# Patient Record
Sex: Female | Born: 1935 | Race: White | Hispanic: No | Smoking: Never smoker
Health system: Southern US, Community
[De-identification: ages and names within clinical notes are randomized; demographics above are authoritative.]

## PROBLEM LIST (undated history)

## (undated) DIAGNOSIS — J339 Nasal polyp, unspecified: Secondary | ICD-10-CM

## (undated) DIAGNOSIS — I82409 Acute embolism and thrombosis of unspecified deep veins of unspecified lower extremity: Secondary | ICD-10-CM

## (undated) DIAGNOSIS — J45909 Unspecified asthma, uncomplicated: Secondary | ICD-10-CM

## (undated) HISTORY — DX: Acute embolism and thrombosis of unspecified deep veins of unspecified lower extremity: I82.409

## (undated) HISTORY — PX: FOOT SURGERY: SHX648

## (undated) HISTORY — PX: TOTAL ABDOMINAL HYSTERECTOMY: SHX209

## (undated) HISTORY — PX: CHOLECYSTECTOMY: SHX55

## (undated) HISTORY — PX: TONSILLECTOMY: SHX5217

## (undated) HISTORY — DX: Unspecified asthma, uncomplicated: J45.909

## (undated) HISTORY — DX: Nasal polyp, unspecified: J33.9

---

## 2001-08-04 ENCOUNTER — Inpatient Hospital Stay (HOSPITAL_COMMUNITY): Admission: AD | Admit: 2001-08-04 | Discharge: 2001-08-08 | Payer: Self-pay | Admitting: Vascular Surgery

## 2003-07-30 ENCOUNTER — Ambulatory Visit: Admission: RE | Admit: 2003-07-30 | Discharge: 2003-07-30 | Payer: Self-pay | Admitting: Family Medicine

## 2004-07-20 ENCOUNTER — Ambulatory Visit: Payer: Self-pay | Admitting: Internal Medicine

## 2004-09-14 ENCOUNTER — Ambulatory Visit: Payer: Self-pay | Admitting: Internal Medicine

## 2004-10-20 ENCOUNTER — Ambulatory Visit: Payer: Self-pay | Admitting: Internal Medicine

## 2004-10-28 ENCOUNTER — Ambulatory Visit: Payer: Self-pay | Admitting: Internal Medicine

## 2004-12-10 ENCOUNTER — Ambulatory Visit: Payer: Self-pay | Admitting: Internal Medicine

## 2004-12-17 ENCOUNTER — Ambulatory Visit: Payer: Self-pay | Admitting: Internal Medicine

## 2005-03-26 ENCOUNTER — Ambulatory Visit: Payer: Self-pay | Admitting: Internal Medicine

## 2005-04-13 ENCOUNTER — Ambulatory Visit: Payer: Self-pay | Admitting: Pulmonary Disease

## 2005-04-26 ENCOUNTER — Ambulatory Visit: Payer: Self-pay | Admitting: Internal Medicine

## 2005-05-17 ENCOUNTER — Ambulatory Visit: Payer: Self-pay | Admitting: Internal Medicine

## 2005-05-31 ENCOUNTER — Ambulatory Visit: Payer: Self-pay | Admitting: Internal Medicine

## 2005-09-28 ENCOUNTER — Ambulatory Visit: Payer: Self-pay | Admitting: Internal Medicine

## 2005-10-13 ENCOUNTER — Ambulatory Visit (HOSPITAL_COMMUNITY): Admission: RE | Admit: 2005-10-13 | Discharge: 2005-10-13 | Payer: Self-pay | Admitting: Otolaryngology

## 2005-12-28 ENCOUNTER — Ambulatory Visit: Payer: Self-pay | Admitting: Internal Medicine

## 2006-04-18 ENCOUNTER — Ambulatory Visit: Payer: Self-pay | Admitting: Internal Medicine

## 2006-10-18 ENCOUNTER — Ambulatory Visit: Payer: Self-pay | Admitting: Internal Medicine

## 2007-01-31 ENCOUNTER — Ambulatory Visit: Payer: Self-pay | Admitting: Internal Medicine

## 2007-05-02 ENCOUNTER — Ambulatory Visit: Payer: Self-pay | Admitting: Internal Medicine

## 2007-08-31 DIAGNOSIS — J209 Acute bronchitis, unspecified: Secondary | ICD-10-CM

## 2007-08-31 DIAGNOSIS — Z8672 Personal history of thrombophlebitis: Secondary | ICD-10-CM | POA: Insufficient documentation

## 2007-08-31 DIAGNOSIS — J330 Polyp of nasal cavity: Secondary | ICD-10-CM | POA: Insufficient documentation

## 2007-08-31 DIAGNOSIS — J329 Chronic sinusitis, unspecified: Secondary | ICD-10-CM | POA: Insufficient documentation

## 2007-08-31 DIAGNOSIS — J309 Allergic rhinitis, unspecified: Secondary | ICD-10-CM | POA: Insufficient documentation

## 2007-08-31 DIAGNOSIS — K219 Gastro-esophageal reflux disease without esophagitis: Secondary | ICD-10-CM | POA: Insufficient documentation

## 2007-09-08 ENCOUNTER — Ambulatory Visit: Payer: Self-pay | Admitting: Internal Medicine

## 2008-01-11 ENCOUNTER — Ambulatory Visit: Payer: Self-pay | Admitting: Internal Medicine

## 2008-04-12 ENCOUNTER — Ambulatory Visit: Payer: Self-pay | Admitting: Internal Medicine

## 2008-09-25 ENCOUNTER — Ambulatory Visit: Payer: Self-pay | Admitting: Internal Medicine

## 2008-09-25 LAB — CONVERTED CEMR LAB
Basophils Absolute: 0.1 10*3/uL (ref 0.0–0.1)
Basophils Relative: 0.6 % (ref 0.0–3.0)
Calcium: 9.4 mg/dL (ref 8.4–10.5)
Chloride: 107 meq/L (ref 96–112)
Creatinine, Ser: 0.7 mg/dL (ref 0.4–1.2)
Eosinophils Relative: 0.6 % (ref 0.0–5.0)
Glucose, Bld: 85 mg/dL (ref 70–99)
HCT: 43.1 % (ref 36.0–46.0)
Lymphs Abs: 2.5 10*3/uL (ref 0.7–4.0)
Monocytes Absolute: 0.6 10*3/uL (ref 0.1–1.0)
Neutro Abs: 7.2 10*3/uL (ref 1.4–7.7)
Platelets: 221 10*3/uL (ref 150.0–400.0)
Potassium: 3.7 meq/L (ref 3.5–5.1)
WBC: 10.5 10*3/uL (ref 4.5–10.5)

## 2009-01-09 ENCOUNTER — Encounter: Payer: Self-pay | Admitting: Adult Health

## 2009-08-05 ENCOUNTER — Ambulatory Visit: Payer: Self-pay | Admitting: Internal Medicine

## 2009-08-05 ENCOUNTER — Encounter: Payer: Self-pay | Admitting: Adult Health

## 2009-09-03 ENCOUNTER — Ambulatory Visit: Payer: Self-pay | Admitting: Internal Medicine

## 2010-01-01 ENCOUNTER — Ambulatory Visit: Payer: Self-pay | Admitting: Internal Medicine

## 2010-02-03 ENCOUNTER — Encounter: Payer: Self-pay | Admitting: Internal Medicine

## 2010-02-09 IMAGING — CR DG CHEST 2V
2 series · 2 of 2 positions shown · non-contrast
Comparison: 04/18/2006

CLINICAL DATA: Fatigue.  Productive cough.  Wheezing.  Nonsmoker.
Asthma.

CHEST - 2 VIEW

[view not recorded (1 of 2)]
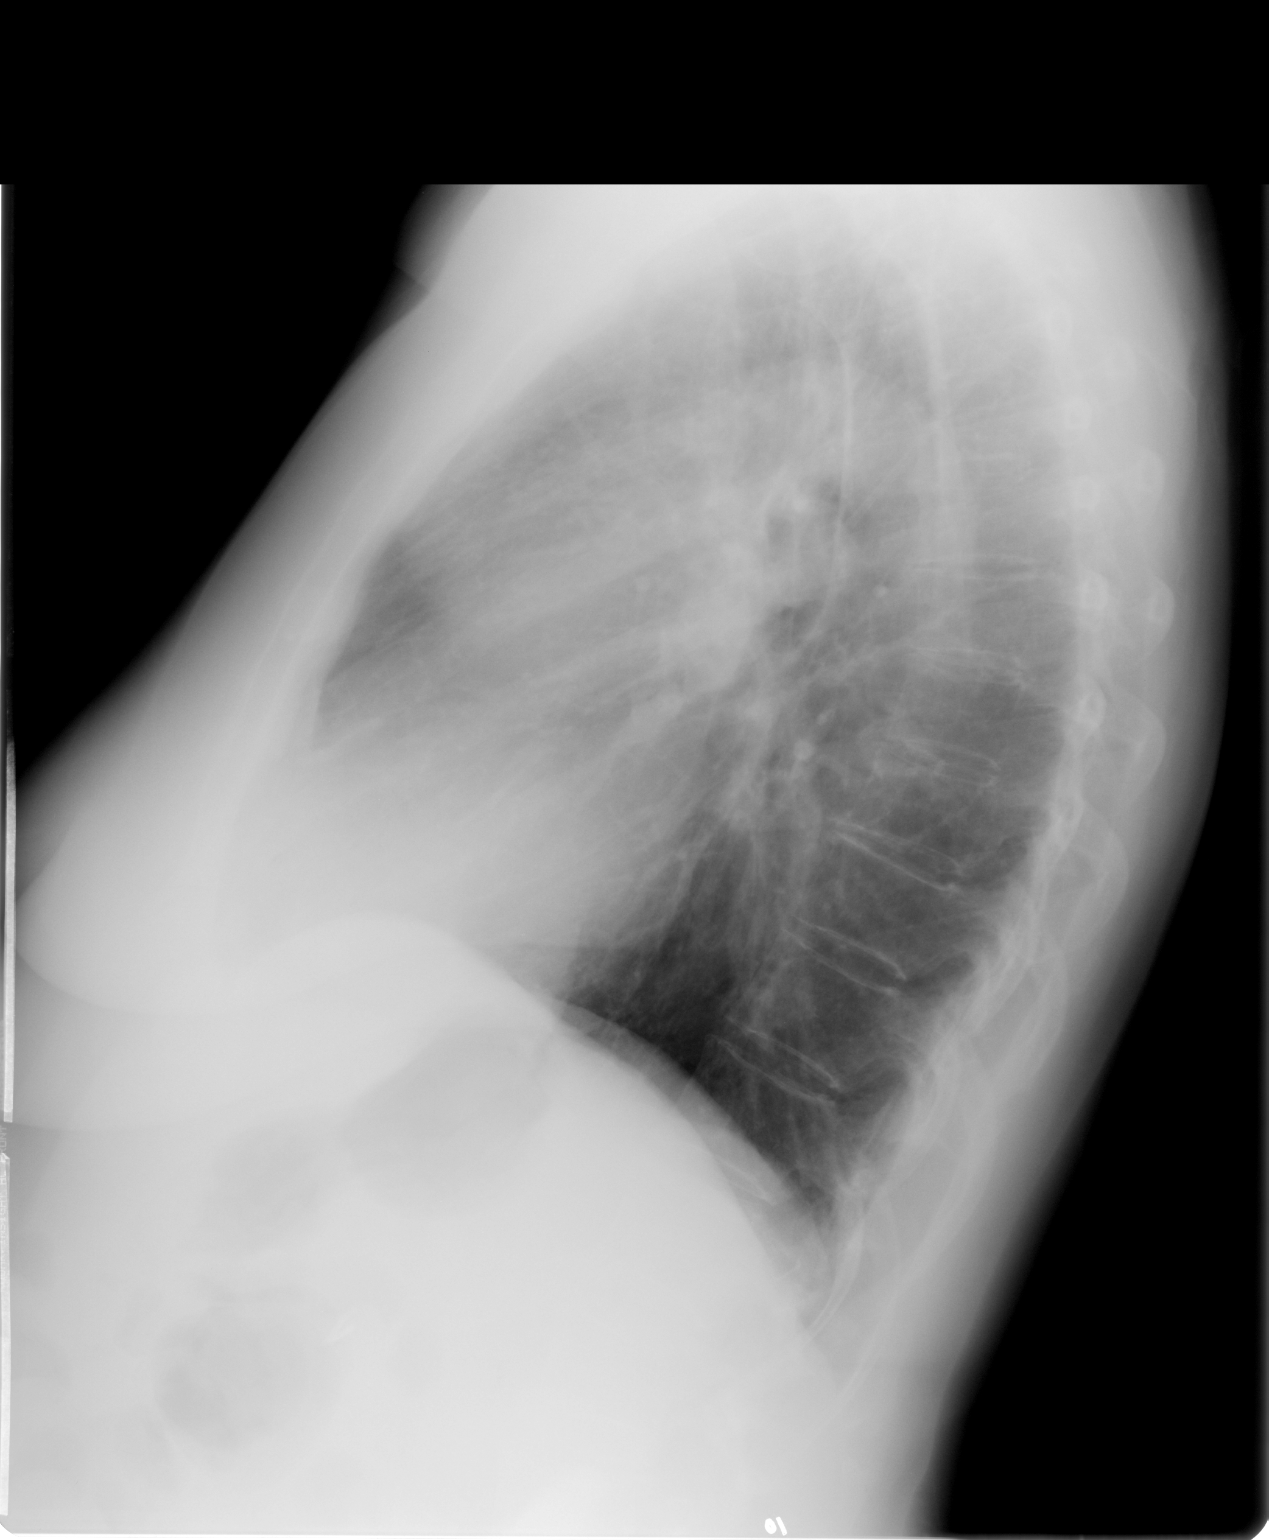

[view not recorded (2 of 2)]
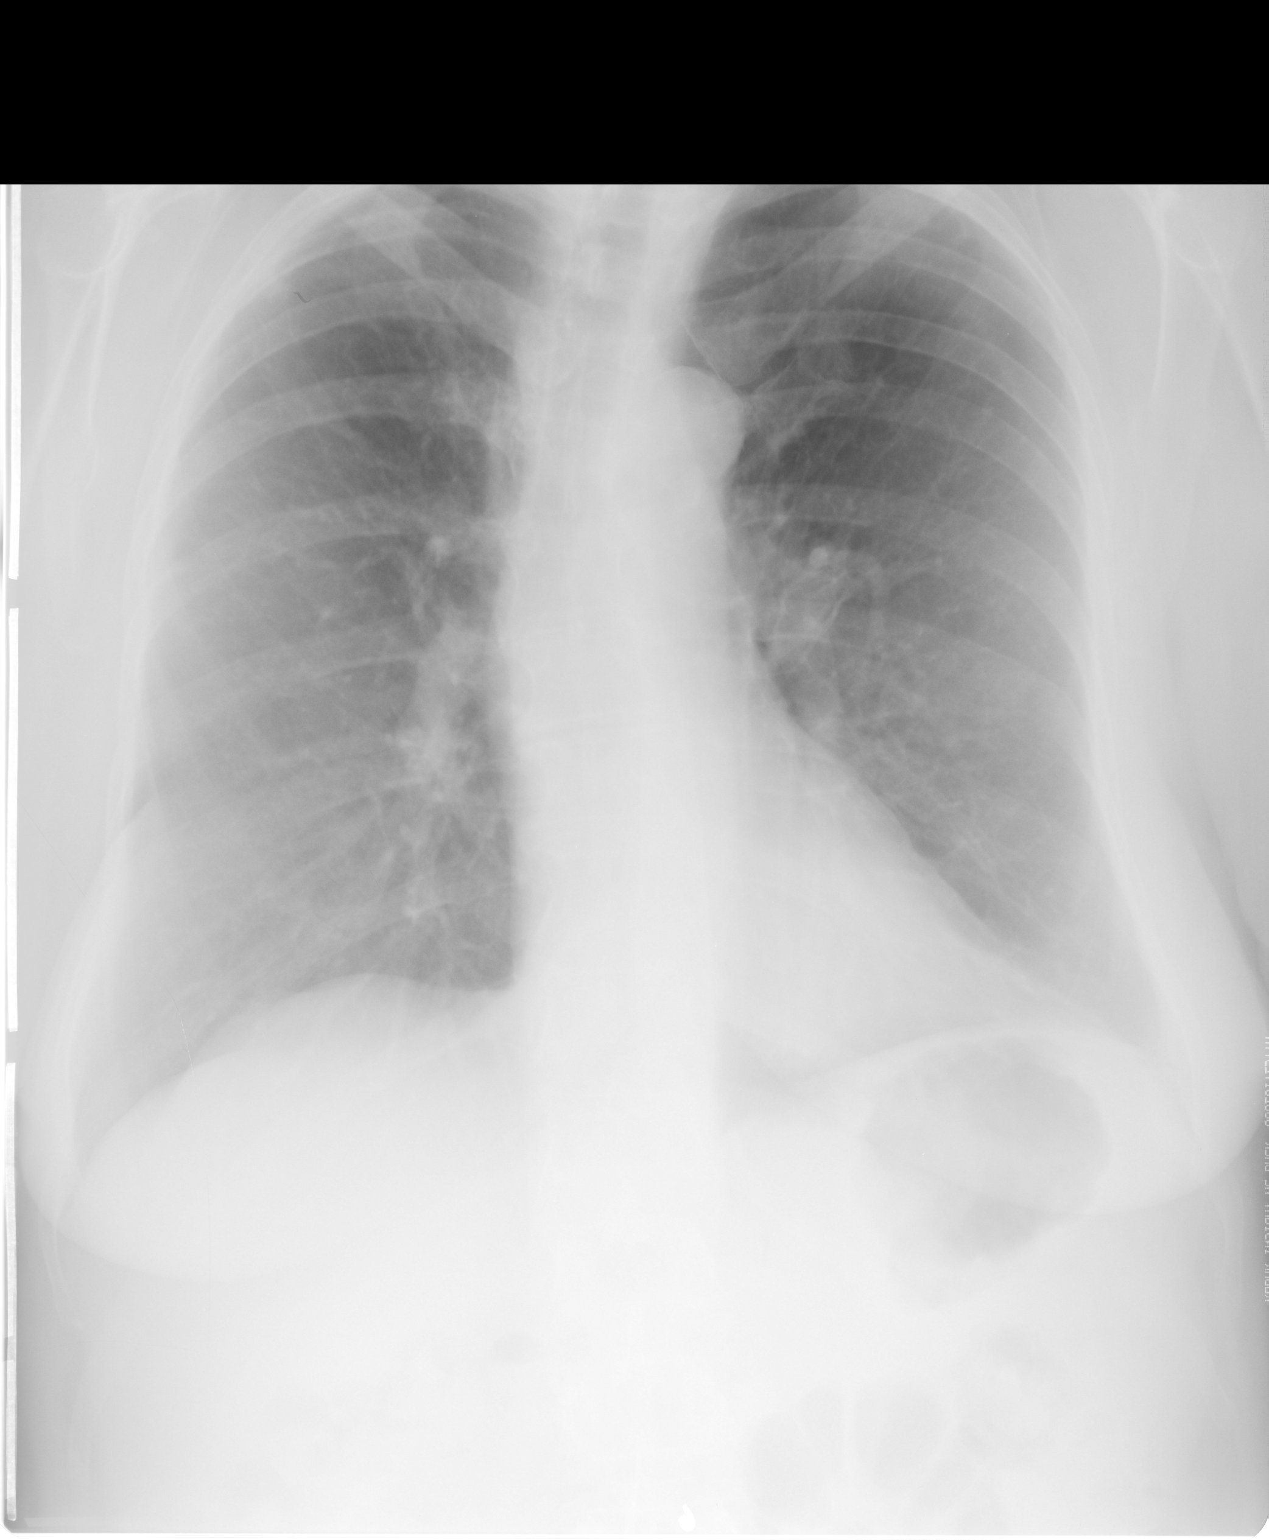

[2 of 2 positions shown; findings below may reference images not displayed]

FINDINGS: Possible mild vertebral body height loss at a upper
thoracic level.  Suboptimally evaluated.  Nonacute posterolateral
right 7th rib fracture.  Mild patient rotation. Midline trachea.
Mild cardiomegaly.  Calcified transverse aorta. No pleural effusion
or pneumothorax. Mild interstitial prominence is similar.  An area
of pleural or subpleural irregularity in the right upper lobe is
unchanged.  Mild left base scar/atelectasis.
IMPRESSION: 1. No acute cardiopulmonary disease.
2.  Cardiomegaly without congestive failure.

## 2010-03-18 ENCOUNTER — Ambulatory Visit: Payer: Self-pay | Admitting: Internal Medicine

## 2010-03-19 ENCOUNTER — Telehealth (INDEPENDENT_AMBULATORY_CARE_PROVIDER_SITE_OTHER): Payer: Self-pay | Admitting: *Deleted

## 2010-05-06 ENCOUNTER — Ambulatory Visit: Payer: Self-pay | Admitting: Internal Medicine

## 2010-07-02 ENCOUNTER — Ambulatory Visit: Payer: Self-pay | Admitting: Internal Medicine

## 2010-07-29 ENCOUNTER — Telehealth: Payer: Self-pay | Admitting: Internal Medicine

## 2010-08-04 NOTE — Assessment & Plan Note (Signed)
Summary: sick/cb   Primary Provider/Referring Provider:  Rodney Booze  CC:  Accute visit-cough-productive in am-green; Had recent Sinus infection.Ebony Farmer  History of Present Illness:  10-02-2009- Rhinosinusitis, allergic rhinitis, asthma/bronchitis, hx DVT Had flu vax. Pneumovax was 2009. Resolved respiratory infection one month ago. Dyspneic with brisk walk. Earlier in winter she was waking with productive cough, but that has almost stopped. Frequently hoarse. Admits significant reflux. Prilosec worked better than the omeprazole her insurance favors. Past hx endoscopy with no local GI doctor.  January 01, 2010- Rhinosinusitis, allergic rhinitis, asthma/ bronchitis, hx DVT Staying in to avoid weather, especially humid days which zap her energy. Episodes of insomnia, no pattern. Uses rescue inhaler occasionally, nebulizer is used daily for pulmicort and up to 4x/day with albuterol. Denies chest pain. Sinus congestion at times. Cough now green without fever.   March 18, 2010- Rhinosinusitis, allergic rhinitis, asthma/ bronchitis, hx DVT Acute visit- gradually worsening nasal congestion, frontal headache, not blowing anything out, or scant stringy/ bloody and some postnasal drip. No fever. She never got around to trying a Neti pot. Blames ragweed now.  Also told her primary doctor that she was waking a lot at night. He suggested her breathing was the problem and got an ONOX ( 18 minutes < 88%) and got her home O2  sleep at 2 L.     Preventive Screening-Counseling & Management  Alcohol-Tobacco     Smoking Status: never  Current Medications (verified): 1)  Singulair 10 Mg  Tabs (Montelukast Sodium) .... Once Daily 2)  Pulmicort 0.25 Mg/60ml  Susp (Budesonide) .... Two Times A Day 3)  Albuterol Sulfate (2.5 Mg/37ml) 0.083% Nebu (Albuterol Sulfate) .Ebony Farmer.. 1 Neb Qid As Needed 4)  Proventil Hfa 108 (90 Base) Mcg/act Aers (Albuterol Sulfate) .... 2 Puffs Four Times A Day As Needed 5)  Nasal  Saline 0.65 %  Soln (Saline) .... As Needed 6)  Lipitor 40 Mg Tabs (Atorvastatin Calcium) .... Take 1 By Mouth Once Daily 7)  Norvasc 10 Mg Tabs (Amlodipine Besylate) .... Take 1 By Mouth Once Daily 8)  Metoprolol Succinate 25 Mg Xr24h-Tab (Metoprolol Succinate) .Ebony Farmer.. 1 Daily 9)  Spiriva Handihaler 18 Mcg Caps (Tiotropium Bromide Monohydrate) .... Take Once Daily 10)  Antivert 25 Mg Tabs (Meclizine Hcl) .... Take 1/2tablet Three Times A Day By Mouth 11)  Klor-Con M20 20 Meq Cr-Tabs (Potassium Chloride Crys Cr) .... Take 1 Tablet Once Daily By Mouth 12)  Furosemide 20 Mg Tabs (Furosemide) .... Take 1 Tablet By Mouth Once A Day 13)  Zyrtec Hives Relief 10 Mg Tabs (Cetirizine Hcl) .... Take 1  Every Morning 14)  Prevacid 15 Mg Cpdr (Lansoprazole) .Ebony Farmer.. 1 Each Evening 15)  Omeprazole 20 Mg Cpdr (Omeprazole) .Ebony Farmer.. 1 Each Morning 16)  Coumadin 3 Mg Tabs (Warfarin Sodium) .... Take 1 By Mouth Once Daily 17)  Alprazolam 0.25 Mg Tabs (Alprazolam) .... Take 1 By Mouth At Bedtime  Allergies (verified): 1)  ! Pcn  Past History:  Past Medical History: Last updated: 09/08/2007 Asthmatic bronchitis Allergic rhinitis DVT/ chronic coumadin nasal polyposis  Past Surgical History: Last updated: Oct 02, 2009 Total Abdominal Hysterectomy Tonsillectomy Cholecystectomy Foot surgery "stomach burst" 1993  Family History: Last updated: October 02, 2009 Mother- died bone cancer Father- died heart, peripheral artery disease  Social History: Last updated: 10/02/09 Patient never smoked.  Divorced she lives with daughter(military) to care for grandchildren in [place]  Risk Factors: Smoking Status: never (03/18/2010)  Review of Systems      See HPI  The patient complains of non-productive cough, headaches, nasal congestion/difficulty breathing through nose, and sneezing.  The patient denies shortness of breath with activity, shortness of breath at rest, productive cough, coughing up blood, chest pain,  irregular heartbeats, acid heartburn, indigestion, loss of appetite, weight change, abdominal pain, difficulty swallowing, and sore throat.    Vital Signs:  Patient profile:   77 year old female Height:      69 inches Weight:      179.38 pounds BMI:     26.59 O2 Sat:      97 % on Room air Pulse rate:   81 / minute BP sitting:   116 / 74  (left arm) Cuff size:   regular  Vitals Entered By: Reynaldo Minium CMA (March 18, 2010 10:41 AM)  O2 Flow:  Room air CC: Accute visit-cough-productive in am-green; Had recent Sinus infection.   Physical Exam  Additional Exam:  General: A/Ox3; pleasant and cooperative, NAD, SKIN: no rash, lesions NODES: no lymphadenopathy HEENT: [place]/AT, EOM- WNL, Conjuctivae- clear, PERRLA, TM-WNL, Nose- clear, Throat- clear. Mallampati  II NECK: Supple w/ fair ROM, JVD- none, normal carotid impulses w/o bruits Thyroid- CHEST: Minor dry cough, no wheeze, unlabored HEART: RRR slow, trace systolic murumur at aortic space ABDOMEN: soft and NT  ZOX:WRUE, nl pulses, no edema under elastic hose. NEURO: Grossly intact to observation      Impression & Recommendations:  Problem # 1:  SINUSITIS (ICD-473.9) Recurrent rhinosinusitis. We will give neb and depo, encourage her to try Neti pot, mucinex, biaxin. Recognize her new home oxygen may be drying. Talk with DME about humidifier for oxygen.   Problem # 2:  NASAL POLYP (ICD-471.0) No recurence of polyps visible anteriorly.  Problem # 3:  ASTHMATIC BRONCHITIS, ACUTE (ICD-466.0) There is no lower respiratory component at this time, but she is aware that her illness might progrress. She will call as needed. Her updated medication list for this problem includes:    Singulair 10 Mg Tabs (Montelukast sodium) ..... Once daily    Pulmicort 0.25 Mg/18ml Susp (Budesonide) .Ebony Farmer..Ebony Farmer Two times a day    Albuterol Sulfate (2.5 Mg/28ml) 0.083% Nebu (Albuterol sulfate) .Ebony Farmer... 1 neb qid as needed    Proventil Hfa 108 (90 Base)  Mcg/act Aers (Albuterol sulfate) .Ebony Farmer... 2 puffs four times a day as needed    Spiriva Handihaler 18 Mcg Caps (Tiotropium bromide monohydrate) .Ebony Farmer... Take once daily    Clarithromycin 500 Mg Tabs (Clarithromycin) .Ebony Farmer... 1 twice daily after meals may affect coumadin  Medications Added to Medication List This Visit: 1)  Coumadin 3 Mg Tabs (Warfarin sodium) .... Take 1 by mouth once daily 2)  Alprazolam 0.25 Mg Tabs (Alprazolam) .... Take 1 by mouth at bedtime 3)  Clarithromycin 500 Mg Tabs (Clarithromycin) .Ebony Farmer.. 1 twice daily after meals may affect coumadin 4)  O2 For Sleep 2l/m  .... Dr Sherril Croon  Other Orders: Est. Patient Level III (45409)  Patient Instructions: 1)  Keep scheduled appointment 2)  Try the Neti pot- it may help you get better faster, and may in the future help to head off some of these. 3)  Neb neo nasal 4)  depo 80 5)  script for antibiotic clarithromycin/ biaxin. Take it after meals. It may boost your coumadin and prolong your blood clotting time, so watch out for increased bleeding. 6)  Consider Mucinex- it may thin mucus so it will blow out easier Prescriptions: CLARITHROMYCIN 500 MG TABS (CLARITHROMYCIN) 1 twice daily after meals May affect coumadin  #  14 x 0   Entered and Authorized by:   Waymon Budge MD   Signed by:   Waymon Budge MD on 03/18/2010   Method used:   Print then Give to Patient   RxID:   [id number]

## 2010-08-04 NOTE — Progress Notes (Signed)
Summary: FYI  Phone Note Call from Patient   Caller: Patient Call For: YOUNG Summary of Call: PT CALLING TO GIVE THE PHONE NUMBER FOR [facility] WHICH DR YOUNG WAS REQUESTING TO BE PUT IN HER CHART. ([phone]) Initial call taken by: Rickard Patience,  March 19, 2010 11:33 AM  Follow-up for Phone Call        Pt states Katie and Dr. Maple Hudson wanted her to call with the name of the Home Care Company and phone number she receives breathing medication and o2 thru.  The company is [facility]  Phone # [phone] Will forward message to Katie/Dr. Maple Hudson so they are aware.  Pls advise if anything further is needed.  Thanks!  Follow-up by: Gweneth Dimitri RN,  March 19, 2010 11:39 AM  Additional Follow-up for Phone Call Additional follow up Details #1::        forwarding to [facility] for their records Additional Follow-up by: Waymon Budge MD,  March 20, 2010 8:43 AM    Additional Follow-up for Phone Call Additional follow up Details #2::    noted Follow-up by: Oneita Jolly,  March 20, 2010 9:31 AM

## 2010-08-04 NOTE — Procedures (Signed)
Summary: [facility]  [facility]   Imported By: Sherian Rein 03/26/2010 11:36:24  _____________________________________________________________________  External Attachment:    Type:   Image     Comment:   External Document

## 2010-08-04 NOTE — Assessment & Plan Note (Signed)
Summary: follow up visit-kcw   Primary Provider/Referring Provider:  Beatrix Fetters Shah/ [facility]  CC:  Follow up visit.  History of Present Illness: 09/25/08- Rhinosinusitis, allergic rhinitis, asthma/ bronchitis, hx DVT For past month had been coughing and congested. She keeps her Jomar Denz grandchildren with viral exposures. Her primary MD in [place] gave her a cortisone shot 2 days ago. Back got sore from persistent cough. Still has some cough and sinus drainage. She believes she had a cold with bronchitis and now the pollen is bothering her also. Feels tired and more easily winded. Chest congestion occasionally wakes her.  August 05, 2009--Presents for an acute office visit. Complains of c/o increased sob w/exertion x 3 wks.,wheezing,coughing alot-dry occass. green when prod.,nasal congestion,sweats occass.,no fever. Has been staying w/ granddaguhter in [place] helping with her kids. They have been sick. She feels tired and worn out. Denies chest pain, orthopnea, hemoptysis, fever, n/v/d, edema, headache.   Sep 24, 2009- Rhinosinusitis, allergic rhinitis, asthma/bronchitis, hx DVT Had flu vax. Pneumovax was 2009. Resolved respiratory infection one month ago. Dyspneic with brisk walk. Earlier in winter she was waking with productive cough, but that has almost stopped. Frequently hoarse. Admits significant reflux. Prilosec worked better than the omeprazole her insurance favors. Past hx endoscopy with no local GI doctor.   Current Medications (verified): 1)  Coumadin 5 Mg .... 1/2 Tab W,f.sa,su 2)  Coumadin 2mg  .... Take The Rest of The Week 3)  Singulair 10 Mg  Tabs (Montelukast Sodium) .... Once Daily 4)  Pulmicort 0.25 Mg/41ml  Susp (Budesonide) .... Two Times A Day 5)  Albuterol Sulfate (2.5 Mg/53ml) 0.083% Nebu (Albuterol Sulfate) .Marland Kitchen.. 1 Neb Qid As Needed 6)  Proventil Hfa 108 (90 Base) Mcg/act Aers (Albuterol Sulfate) .... 2 Puffs Four Times A Day As Needed 7)  Nasal Saline 0.65 %  Soln (Saline)  .... As Needed 8)  Lipitor 40 Mg Tabs (Atorvastatin Calcium) .... Take 1 By Mouth Once Daily 9)  Norvasc 10 Mg Tabs (Amlodipine Besylate) .... Take 1 By Mouth Once Daily 10)  Metoprolol Succinate 25 Mg Xr24h-Tab (Metoprolol Succinate) .Marland Kitchen.. 1 Daily 11)  Spiriva Handihaler 18 Mcg Caps (Tiotropium Bromide Monohydrate) .... Take Once Daily 12)  Antivert 25 Mg Tabs (Meclizine Hcl) .... Take 1/2tablet Three Times A Day By Mouth 13)  Klor-Con M20 20 Meq Cr-Tabs (Potassium Chloride Crys Cr) .... Take 1 Tablet Once Daily By Mouth 14)  Furosemide 20 Mg Tabs (Furosemide) .... Take 1 Tablet By Mouth Once A Day 15)  Zyrtec Hives Relief 10 Mg Tabs (Cetirizine Hcl) .... Take 1  Every Morning  Allergies (verified): 1)  ! Pcn  Past History:  Past Medical History: Last updated: 09/08/2007 Asthmatic bronchitis Allergic rhinitis DVT/ chronic coumadin nasal polyposis  Family History: Last updated: 09-24-09 Mother- died bone cancer Father- died heart, peripheral artery disease  Social History: Last updated: 09-24-09 Patient never smoked.  Divorced she lives with daughter(military) to care for grandchildren in [place]  Risk Factors: Smoking Status: never (09/08/2007)  Past Surgical History: Total Abdominal Hysterectomy Tonsillectomy Cholecystectomy Foot surgery "stomach burst" 1993  Family History: Mother- died bone cancer Father- died heart, peripheral artery disease  Social History: Patient never smoked.  Divorced she lives with daughter(military) to care for grandchildren in [place]  Review of Systems      See HPI       The patient complains of dyspnea on exertion.  The patient denies anorexia, fever, weight loss, weight gain, vision loss, decreased hearing, hoarseness,  chest pain, syncope, peripheral edema, prolonged cough, headaches, hemoptysis, and severe indigestion/heartburn.    Vital Signs:  Patient profile:   77 year old female Height:      69 inches Weight:       181.25 pounds BMI:     26.86 O2 Sat:      98 % on Room air Pulse rate:   62 / minute BP sitting:   124 / 80  (left arm) Cuff size:   regular  Vitals Entered By: Reynaldo Minium CMA (September 03, 2009 2:04 PM)  O2 Flow:  Room air  Physical Exam  Additional Exam:  General: A/Ox3; pleasant and cooperative, NAD, SKIN: no rash, lesions NODES: no lymphadenopathy HEENT: [place]/AT, EOM- WNL, Conjuctivae- clear, PERRLA, TM-WNL, Nose- clear, Throat- red. Mallampati  II NECK: Supple w/ fair ROM, JVD- none, normal carotid impulses w/o bruits Thyroid- CHEST: Coarse BS w/ exp wheezing.  HEART: RRR slow, trace systolic murumur at aortic space ABDOMEN: soft and NT  KWI:OXBD, nl pulses, no edema under elastic hose. NEURO: Grossly intact to observation      Impression & Recommendations:  Problem # 1:  ASTHMATIC BRONCHITIS, ACUTE (ICD-466.0)  She and I both suspect reflux is the cause for some of her coughing spells and hoarseness.Marland Kitchen She will continue omeprazole, but add otc prevacid in evening for one month trial. Reflux precautions. The following medications were removed from the medication list:    Avelox 400 Mg Tabs (Moxifloxacin hcl) .Marland Kitchen... 1 by mouth once daily Her updated medication list for this problem includes:    Singulair 10 Mg Tabs (Montelukast sodium) ..... Once daily    Pulmicort 0.25 Mg/63ml Susp (Budesonide) .Marland Kitchen..Marland Kitchen Two times a day    Albuterol Sulfate (2.5 Mg/45ml) 0.083% Nebu (Albuterol sulfate) .Marland Kitchen... 1 neb qid as needed    Proventil Hfa 108 (90 Base) Mcg/act Aers (Albuterol sulfate) .Marland Kitchen... 2 puffs four times a day as needed    Spiriva Handihaler 18 Mcg Caps (Tiotropium bromide monohydrate) .Marland Kitchen... Take once daily  Problem # 2:  DEEP VENOUS THROMBOPHLEBITIS, HX OF (ICD-V12.52) No apparent active phelbitis on review at this visit. we reviewed warning signs.  Medications Added to Medication List This Visit: 1)  Zyrtec Hives Relief 10 Mg Tabs (Cetirizine hcl) .... Take 1  every  morning  Other Orders: Est. Patient Level III (53299)  Patient Instructions: 1)  Please schedule a follow-up appointment in 4 months. 2)  Try adding otc Prevacid before supper each evening for one month. Continue omeprazole  before breakfast in the morning.

## 2010-08-04 NOTE — Assessment & Plan Note (Signed)
Summary: breathing problem/ mbw   Primary Provider/Referring Provider:  Beatrix Fetters Shah/ [place]  CC:  c/o increased sob w/exertion x 3 wks., wheezing, coughing alot-dry occass. green when prod., nasal congestion, sweats occass., and no fever.  History of Present Illness: Current Problems:  NASAL POLYP (ICD-471.0) SINUSITIS (ICD-473.9) DEEP VENOUS THROMBOPHLEBITIS, HX OF (ICD-V12.52) ESOPHAGEAL REFLUX (ICD-530.81) ASTHMATIC BRONCHITIS, ACUTE (ICD-466.0) ALLERGIC RHINITIS (ICD-33.71)  77 year old woman returns for follow-up of allergic rhinitis and asthma with significant history of sinusitis, deep vein thrombosis, and esophageal reflux.  From 01/11/08-  She was hospitalized at [facility] while visiting family.  She says it was a concern about palpitation, and she had an echocardiogram and stress test.  She feels better now.  Workup was apparently negative.  Recent increased sinus drainage and cough was productive yesterday.  It hurts her back when she coughs.  04/12/08- More time outside increased congestion head and chest "ragweed". "OK today". Variable reflux. Variable good and bad days anyway. Denies pain, blood, purulent, fever.  09/25/08- Rhinosinusitis, allergic rhinitis, asthma/ bronchitis, hx DVT For past month had been coughing and congested. She keeps her young grandchildren with viral exposures. Her primary MD in [place] gave her a cortisone shot 2 days ago. Back got sore from persistent cough. Still has some cough and sinus drainage. She believes she had a cold with bronchitis and now the pollen is bothering her also. Feels tired and more easily winded. Chest congestion occasionally wakes her.  August 05, 2009 --Presents for an acute office visit. Complains of c/o increased sob w/exertion x 3 wks.,wheezing,coughing alot-dry occass. green when prod.,nasal congestion,sweats occass.,no fever lots of congestion   August 05, 2009--Presents for an acute office visit. Complains of  c/o increased sob w/exertion x 3 wks.,wheezing,coughing alot-dry occass. green when prod.,nasal congestion,sweats occass.,no fever. Has been staying w/ granddaguhter in [place] helping with her kids. They have been sick. She feels tired and worn out. Denies chest pain, orthopnea, hemoptysis, fever, n/v/d, edema, headache.    Current Medications (verified): 1)  Coumadin 5 Mg .... 1/2 Tab W,f.sa,su 2)  Coumadin 2mg  .... Take The Rest of The Week 3)  Singulair 10 Mg  Tabs (Montelukast Sodium) .... Once Daily 4)  Pulmicort 0.25 Mg/79ml  Susp (Budesonide) .... Two Times A Day 5)  Albuterol Sulfate (2.5 Mg/43ml) 0.083% Nebu (Albuterol Sulfate) .Marland Kitchen.. 1 Neb Qid As Needed 6)  Proventil Hfa 108 (90 Base) Mcg/act Aers (Albuterol Sulfate) .... 2 Puffs Four Times A Day As Needed 7)  Nasal Saline 0.65 %  Soln (Saline) .... As Needed 8)  Lipitor 40 Mg Tabs (Atorvastatin Calcium) .... Take 1 By Mouth Once Daily 9)  Norvasc 10 Mg Tabs (Amlodipine Besylate) .... Take 1 By Mouth Once Daily 10)  Metoprolol Succinate 25 Mg Xr24h-Tab (Metoprolol Succinate) .Marland Kitchen.. 1 Daily 11)  Spiriva Handihaler 18 Mcg Caps (Tiotropium Bromide Monohydrate) .... Take Once Daily 12)  Antivert 25 Mg Tabs (Meclizine Hcl) .... Take 1/2tablet Three Times A Day By Mouth 13)  Klor-Con M20 20 Meq Cr-Tabs (Potassium Chloride Crys Cr) .... Take 1 Tablet Once Daily By Mouth 14)  Furosemide 20 Mg Tabs (Furosemide) .... Take 1 Tablet By Mouth Once A Day  Allergies (verified): 1)  ! Pcn  Past History:  Past Medical History: Last updated: 09/08/2007 Asthmatic bronchitis Allergic rhinitis DVT/ chronic coumadin nasal polyposis  Social History: Last updated: 09/25/2008 Patient never smoked.   she lives with daughter(military) to care for grandchildren in [place]  Risk Factors: Smoking Status:  never (09/08/2007)  Vital Signs:  Patient profile:   77 year old female Height:      69 inches Weight:      180 pounds BMI:     26.68 O2  Sat:      90 % on Room air Temp:     98.6 degrees F oral Pulse rate:   90 / minute BP sitting:   152 / 88  (left arm) Cuff size:   regular  Vitals Entered By: Elray Buba RN (August 05, 2009 11:33 AM)  Nutrition Counseling: Patient's BMI is greater than 25 and therefore counseled on weight management options.  O2 Flow:  Room air CC: c/o increased sob w/exertion x 3 wks.,wheezing,coughing alot-dry occass. green when prod.,nasal congestion,sweats occass.,no fever Is Patient Diabetic? No Comments Medications reviewed with patient  Elray Buba RN  August 05, 2009 11:33 AM    Physical Exam  Additional Exam:  General: A/Ox3; pleasant and cooperative, NAD, SKIN: no rash, lesions NODES: no lymphadenopathy HEENT: [place]/AT, EOM- WNL, Conjuctivae- clear, PERRLA, TM-WNL, Nose- clear, Throat- clear and wnl NECK: Supple w/ fair ROM, JVD- none, normal carotid impulses w/o bruits Thyroid- CHEST: Coarse BS w/ exp wheezing.  HEART: RRR slow, trace systolic murumur at aortic space ABDOMEN: soft and NT  WUJ:WJXB, nl pulses, no edema under elastic hose. NEURO: Grossly intact to observation      Impression & Recommendations:  Problem # 1:  ASTHMATIC BRONCHITIS, ACUTE (ICD-466.0) Exacerbation  REC: Depo Medrol 120mg  x 1  Avelox 400mg  once daily for 7 days w/ food Mucinex DM two times a day as needed cough  Increase fluids.  Prednisone taper over next week. follow up Dr. Maple Hudson 4 weeks and as needed  Please contact office for sooner follow up if symptoms do not improve or worsen  Her updated medication list for this problem includes:    Singulair 10 Mg Tabs (Montelukast sodium) ..... Once daily    Pulmicort 0.25 Mg/86ml Susp (Budesonide) .Marland Kitchen..Marland Kitchen Two times a day    Albuterol Sulfate (2.5 Mg/46ml) 0.083% Nebu (Albuterol sulfate) .Marland Kitchen... 1 neb qid as needed    Proventil Hfa 108 (90 Base) Mcg/act Aers (Albuterol sulfate) .Marland Kitchen... 2 puffs four times a day as needed    Spiriva Handihaler 18 Mcg Caps  (Tiotropium bromide monohydrate) .Marland Kitchen... Take once daily    Avelox 400 Mg Tabs (Moxifloxacin hcl) .Marland Kitchen... 1 by mouth once daily  Orders: Depo- Medrol 80mg  (J1040) Depo- Medrol 40mg  (J1030) Admin of Therapeutic Inj  intramuscular or subcutaneous (14782) Nebulizer Tx (95621) Est. Patient Level IV (30865)  Medications Added to Medication List This Visit: 1)  Coumadin 5 Mg  .... 1/2 tab w,f.sa,su 2)  Coumadin 2mg   .... Take the rest of the week 3)  Spiriva Handihaler 18 Mcg Caps (Tiotropium bromide monohydrate) .... Take once daily 4)  Antivert 25 Mg Tabs (Meclizine hcl) .... Take 1/2tablet three times a day by mouth 5)  Klor-con M20 20 Meq Cr-tabs (Potassium chloride crys cr) .... Take 1 tablet once daily by mouth 6)  Furosemide 20 Mg Tabs (Furosemide) .... Take 1 tablet by mouth once a day 7)  Avelox 400 Mg Tabs (Moxifloxacin hcl) .Marland Kitchen.. 1 by mouth once daily 8)  Prednisone 10 Mg Tabs (Prednisone) .... 4 tabs for 2 days, then 3 tabs for 2 days, 2 tabs for 2 days, then 1 tab for 2 days, then stop  Complete Medication List: 1)  Coumadin 5 Mg  .... 1/2 tab w,f.sa,su 2)  Coumadin  2mg   .... Take the rest of the week 3)  Singulair 10 Mg Tabs (Montelukast sodium) .... Once daily 4)  Pulmicort 0.25 Mg/21ml Susp (Budesonide) .... Two times a day 5)  Albuterol Sulfate (2.5 Mg/10ml) 0.083% Nebu (Albuterol sulfate) .Marland Kitchen.. 1 neb qid as needed 6)  Proventil Hfa 108 (90 Base) Mcg/act Aers (Albuterol sulfate) .... 2 puffs four times a day as needed 7)  Nasal Saline 0.65 % Soln (Saline) .... As needed 8)  Lipitor 40 Mg Tabs (Atorvastatin calcium) .... Take 1 by mouth once daily 9)  Norvasc 10 Mg Tabs (Amlodipine besylate) .... Take 1 by mouth once daily 10)  Metoprolol Succinate 25 Mg Xr24h-tab (Metoprolol succinate) .Marland Kitchen.. 1 daily 11)  Spiriva Handihaler 18 Mcg Caps (Tiotropium bromide monohydrate) .... Take once daily 12)  Antivert 25 Mg Tabs (Meclizine hcl) .... Take 1/2tablet three times a day by mouth 13)   Klor-con M20 20 Meq Cr-tabs (Potassium chloride crys cr) .... Take 1 tablet once daily by mouth 14)  Furosemide 20 Mg Tabs (Furosemide) .... Take 1 tablet by mouth once a day 15)  Avelox 400 Mg Tabs (Moxifloxacin hcl) .Marland Kitchen.. 1 by mouth once daily 16)  Prednisone 10 Mg Tabs (Prednisone) .... 4 tabs for 2 days, then 3 tabs for 2 days, 2 tabs for 2 days, then 1 tab for 2 days, then stop  Patient Instructions: 1)  Avelox 400mg  once daily for 7 days w/ food 2)  Mucinex DM two times a day as needed cough  3)  Increase fluids.  4)  Prednisone taper over next week. 5)  follow up Dr. Maple Hudson 4 weeks and as needed  6)  Please contact office for sooner follow up if symptoms do not improve or worsen  Prescriptions: PREDNISONE 10 MG TABS (PREDNISONE) 4 tabs for 2 days, then 3 tabs for 2 days, 2 tabs for 2 days, then 1 tab for 2 days, then stop  #20 x 0   Entered and Authorized by:   Rubye Oaks NP   Signed by:   Ajax Schroll NP on 08/05/2009   Method used:   Electronically to        [facility] (retail)       [address]       [place], [place]  [address]       Ph: [phone]       Fax: [phone]   RxID:   [id number] AVELOX 400 MG TABS (MOXIFLOXACIN HCL) 1 by mouth once daily  #7 x 0   Entered and Authorized by:   Rubye Oaks NP   Signed by:   Rosell Khouri NP on 08/05/2009   Method used:   Electronically to        [facility] (retail)       [address]       [place], [place]  [address]       Ph: [phone]       Fax: [phone]   RxID:   [id number]    Medication Administration  Injection # 1:    Medication: Depo- Medrol 80mg     Diagnosis: ASTHMATIC BRONCHITIS, ACUTE (ICD-466.0)    Route: IM    Site: RUOQ gluteus    Exp Date: 10-11    Lot #: [id number]    Mfr: Teva    Patient tolerated injection without complications    Given  by: Elray Buba RN (August 05, 2009 12:20 PM)  Injection # 2:    Medication:  Depo- Medrol 40mg     Diagnosis: ASTHMATIC BRONCHITIS, ACUTE (ICD-466.0)    Route: IM    Site: RUOQ gluteus    Exp Date: 10-11    Lot #: [id number]    Mfr: Teva    Patient tolerated injection without complications    Given by: Elray Buba RN (August 05, 2009 12:20 PM)  Medication # 1:    Medication: Xopenex 1.25mg     Diagnosis: ASTHMATIC BRONCHITIS, ACUTE (ICD-466.0)    Dose: 1.25mg     Route: inhaled    Exp Date: 09-11    Lot #: [id number]    Mfr: SEPRACOR    Patient tolerated medication without complications    Given by: Elray Buba RN (August 05, 2009 12:22 PM)  Orders Added: 1)  Depo- Medrol 80mg  [J1040] 2)  Depo- Medrol 40mg  [J1030] 3)  Admin of Therapeutic Inj  intramuscular or subcutaneous [96372] 4)  Nebulizer Tx [94640] 5)  Est. Patient Level IV [91478]

## 2010-08-04 NOTE — Assessment & Plan Note (Signed)
Summary: rov 4 months///kp   Primary Provider/Referring Provider:  Rodney Booze  CC:  4 month follow up visit-"no real bad episodes;good and bad days"..  History of Present Illness: 09/25/08- Rhinosinusitis, allergic rhinitis, asthma/ bronchitis, hx DVT For past month had been coughing and congested. She keeps her [place] grandchildren with viral exposures. Her primary MD in [place] gave her a cortisone shot 2 days ago. Back got sore from persistent cough. Still has some cough and sinus drainage. She believes she had a cold with bronchitis and now the pollen is bothering her also. Feels tired and more easily winded. Chest congestion occasionally wakes her.  August 05, 2009--Presents for an acute office visit. Complains of c/o increased sob w/exertion x 3 wks.,wheezing,coughing alot-dry occass. green when prod.,nasal congestion,sweats occass.,no fever. Has been staying w/ granddaguhter in [place] helping with her kids. They have been sick. She feels tired and worn out. Denies chest pain, orthopnea, hemoptysis, fever, n/v/d, edema, headache.   October 03, 2009- Rhinosinusitis, allergic rhinitis, asthma/bronchitis, hx DVT Had flu vax. Pneumovax was 2009. Resolved respiratory infection one month ago. Dyspneic with brisk walk. Earlier in winter she was waking with productive cough, but that has almost stopped. Frequently hoarse. Admits significant reflux. Prilosec worked better than the omeprazole her insurance favors. Past hx endoscopy with no local GI doctor.  January 01, 2010- Rhinosinusitis, allergic rhinitis, asthma/ bronchitis, hx DVT Staying in to avoid weather, especially humid days which zap her energy. Episodes of insomnia, no pattern. Uses rescue inhaler occasionally, nebulizer is used daily for pulmicort and up to 4x/day with albuterol. Denies chest pain. Sinus congestion at times. Cough now green without fever.    Preventive Screening-Counseling & Management  Alcohol-Tobacco  Smoking Status: never  Current Medications (verified): 1)  Coumadin 5 Mg .... 1/2 Tab W,f.sa,su 2)  Coumadin 2mg  .... Take The Rest of The Week 3)  Singulair 10 Mg  Tabs (Montelukast Sodium) .... Once Daily 4)  Pulmicort 0.25 Mg/83ml  Susp (Budesonide) .... Two Times A Day 5)  Albuterol Sulfate (2.5 Mg/73ml) 0.083% Nebu (Albuterol Sulfate) .Marland Kitchen.. 1 Neb Qid As Needed 6)  Proventil Hfa 108 (90 Base) Mcg/act Aers (Albuterol Sulfate) .... 2 Puffs Four Times A Day As Needed 7)  Nasal Saline 0.65 %  Soln (Saline) .... As Needed 8)  Lipitor 40 Mg Tabs (Atorvastatin Calcium) .... Take 1 By Mouth Once Daily 9)  Norvasc 10 Mg Tabs (Amlodipine Besylate) .... Take 1 By Mouth Once Daily 10)  Metoprolol Succinate 25 Mg Xr24h-Tab (Metoprolol Succinate) .Marland Kitchen.. 1 Daily 11)  Spiriva Handihaler 18 Mcg Caps (Tiotropium Bromide Monohydrate) .... Take Once Daily 12)  Antivert 25 Mg Tabs (Meclizine Hcl) .... Take 1/2tablet Three Times A Day By Mouth 13)  Klor-Con M20 20 Meq Cr-Tabs (Potassium Chloride Crys Cr) .... Take 1 Tablet Once Daily By Mouth 14)  Furosemide 20 Mg Tabs (Furosemide) .... Take 1 Tablet By Mouth Once A Day 15)  Zyrtec Hives Relief 10 Mg Tabs (Cetirizine Hcl) .... Take 1  Every Morning  Allergies (verified): 1)  ! Pcn  Past History:  Past Medical History: Last updated: 09/08/2007 Asthmatic bronchitis Allergic rhinitis DVT/ chronic coumadin nasal polyposis  Past Surgical History: Last updated: 10-03-2009 Total Abdominal Hysterectomy Tonsillectomy Cholecystectomy Foot surgery "stomach burst" 1993  Family History: Last updated: 10-03-09 Mother- died bone cancer Father- died heart, peripheral artery disease  Social History: Last updated: 2009-10-03 Patient never smoked.  Divorced she lives with daughter(military) to care for grandchildren in  [place]  Risk Factors: Smoking Status: never (01/01/2010)  Review of Systems      See HPI       The patient complains of shortness of  breath with activity, productive cough, and nasal congestion/difficulty breathing through nose.  The patient denies shortness of breath at rest, non-productive cough, coughing up blood, chest pain, irregular heartbeats, acid heartburn, indigestion, loss of appetite, weight change, abdominal pain, difficulty swallowing, sore throat, tooth/dental problems, headaches, and sneezing.    Vital Signs:  Patient profile:   77 year old female Height:      69 inches Weight:      178 pounds BMI:     26.38 O2 Sat:      97 % on Room air Pulse rate:   66 / minute BP sitting:   140 / 80  (left arm) Cuff size:   regular  Vitals Entered By: Reynaldo Minium CMA (January 01, 2010 1:50 PM)  O2 Flow:  Room air CC: 4 month follow up visit-"no real bad episodes;good and bad days".   Physical Exam  Additional Exam:  General: A/Ox3; pleasant and cooperative, NAD, SKIN: no rash, lesions NODES: no lymphadenopathy HEENT: [place]/AT, EOM- WNL, Conjuctivae- clear, PERRLA, TM-WNL, Nose- clear, Throat- red. Mallampati  II NECK: Supple w/ fair ROM, JVD- none, normal carotid impulses w/o bruits Thyroid- CHEST: Minor dry cough, no wheeze, unlabored HEART: RRR slow, trace systolic murumur at aortic space ABDOMEN: soft and NT  ZOX:WRUE, nl pulses, no edema under elastic hose. NEURO: Grossly intact to observation      Impression & Recommendations:  Problem # 1:  SINUSITIS (ICD-473.9) Treat this episode as an acute rhinosinusitis. Explained antibiotics/ coumadin. Will give biaxin since she is allergic to PCN.  Problem # 2:  ESOPHAGEAL REFLUX (ICD-530.81)  Throat is red- suspect she still deals with reflux but she is better taking omeprazole in AM, and prevacid at night. This has improved hoarseness.  Her updated medication list for this problem includes:    Prevacid 15 Mg Cpdr (Lansoprazole) .Marland Kitchen... 1 each evening    Omeprazole 20 Mg Cpdr (Omeprazole) .Marland Kitchen... 1 each morning  Problem # 3:  NASAL POLYP (ICD-471.0) No  recurrence seen, but she knows that is not unusual.  Medications Added to Medication List This Visit: 1)  Prevacid 15 Mg Cpdr (Lansoprazole) .Marland Kitchen.. 1 each evening 2)  Omeprazole 20 Mg Cpdr (Omeprazole) .Marland Kitchen.. 1 each morning  Other Orders: Est. Patient Level IV (45409)  Patient Instructions: 1)  Please schedule a follow-up appointment in 6 months. 2)  Continue present meds

## 2010-08-04 NOTE — Assessment & Plan Note (Signed)
Summary: congestion/ SOB/ ok per katie//kp   Primary Provider/Referring Provider:  Rodney Booze  CC:  Acute visit-chest congestion(tightness), cough-dry, SOB with activity, and ? fever/chills..  History of Present Illness:  January 01, 2010- Rhinosinusitis, allergic rhinitis, asthma/ bronchitis, hx DVT Staying in to avoid weather, especially humid days which zap her energy. Episodes of insomnia, no pattern. Uses rescue inhaler occasionally, nebulizer is used daily for pulmicort and up to 4x/day with albuterol. Denies chest pain. Sinus congestion at times. Cough now green without fever.   March 18, 2010- Rhinosinusitis, allergic rhinitis, asthma/ bronchitis, hx DVT Acute visit- gradually worsening nasal congestion, frontal headache, not blowing anything out, or scant stringy/ bloody and some postnasal drip. No fever. She never got around to trying a Neti pot. Blames ragweed now.  Also told her primary doctor that she was waking a lot at night. He suggested her breathing was the problem and got an ONOX ( 18 minutes < 88%) and got her home O2  sleep at 2 L.   May 06, 2010- Rhinosinusitis, allergic rhinitis, asthma/ bronchitis, hx DVT Nurse-CC: Acute visit-chest congestion(tightness), cough-dry, SOB with activity, ? fever/chills. Acute visit as above. She fought off a bronchitis with chills and fever 3 weeks ago, nasal congestion. Took an antibiotic. Current episode began about 4 days ago. Began needing more neb trreatments for dyspnea. Taking care of grandaughter. Yesterday got very SOB pushing shopping cart. Today easy DOE, hoarse. Not coughing productively and no fever. Not swelling worse than usual and no chest pain.     Preventive Screening-Counseling & Management  Alcohol-Tobacco     Smoking Status: never  Current Medications (verified): 1)  Singulair 10 Mg  Tabs (Montelukast Sodium) .... Once Daily 2)  Pulmicort 0.25 Mg/26ml  Susp (Budesonide) .... Two Times A Day 3)   Albuterol Sulfate (2.5 Mg/58ml) 0.083% Nebu (Albuterol Sulfate) .Marland Kitchen.. 1 Neb Qid As Needed 4)  Proventil Hfa 108 (90 Base) Mcg/act Aers (Albuterol Sulfate) .... 2 Puffs Four Times A Day As Needed 5)  Nasal Saline 0.65 %  Soln (Saline) .... As Needed 6)  Lipitor 40 Mg Tabs (Atorvastatin Calcium) .... Take 1 By Mouth Once Daily 7)  Norvasc 10 Mg Tabs (Amlodipine Besylate) .... Take 1 By Mouth Once Daily 8)  Metoprolol Succinate 25 Mg Xr24h-Tab (Metoprolol Succinate) .Marland Kitchen.. 1 Daily 9)  Spiriva Handihaler 18 Mcg Caps (Tiotropium Bromide Monohydrate) .... Take Once Daily 10)  Antivert 25 Mg Tabs (Meclizine Hcl) .... Take 1/2tablet Three Times A Day By Mouth 11)  Klor-Con M20 20 Meq Cr-Tabs (Potassium Chloride Crys Cr) .... Take 1 Tablet Once Daily By Mouth 12)  Furosemide 20 Mg Tabs (Furosemide) .... Take 1 Tablet By Mouth Once A Day 13)  Zyrtec Hives Relief 10 Mg Tabs (Cetirizine Hcl) .... Take 1  Every Morning 14)  Prevacid 15 Mg Cpdr (Lansoprazole) .Marland Kitchen.. 1 Each Evening 15)  Omeprazole 20 Mg Cpdr (Omeprazole) .Marland Kitchen.. 1 Each Morning 16)  Coumadin 3 Mg Tabs (Warfarin Sodium) .... Take 1 By Mouth Once Daily 17)  Alprazolam 0.25 Mg Tabs (Alprazolam) .... Take 1 By Mouth At Bedtime 18)  O2 For Sleep 2l/m .... Dr Sherril Croon  Allergies (verified): 1)  ! Pcn  Past History:  Past Medical History: Last updated: 09/08/2007 Asthmatic bronchitis Allergic rhinitis DVT/ chronic coumadin nasal polyposis  Past Surgical History: Last updated: 09-23-09 Total Abdominal Hysterectomy Tonsillectomy Cholecystectomy Foot surgery "stomach burst" 1993  Family History: Last updated: 23-Sep-2009 Mother- died bone cancer Father- died heart, peripheral artery  disease  Social History: Last updated: 09/03/2009 Patient never smoked.  Divorced she lives with daughter(military) to care for grandchildren in [place]  Risk Factors: Smoking Status: never (05/06/2010)  Review of Systems      See HPI       The patient  complains of shortness of breath with activity and non-productive cough.  The patient denies shortness of breath at rest, coughing up blood, chest pain, irregular heartbeats, acid heartburn, indigestion, loss of appetite, weight change, abdominal pain, difficulty swallowing, sore throat, tooth/dental problems, headaches, nasal congestion/difficulty breathing through nose, and sneezing.    Vital Signs:  Patient profile:   77 year old female Height:      69 inches Weight:      182.25 pounds BMI:     27.01 O2 Sat:      96 % on Room air Temp:     97.5 degrees F oral Pulse rate:   73 / minute BP sitting:   122 / 76  (left arm) Cuff size:   regular  Vitals Entered By: Reynaldo Minium CMA (May 06, 2010 2:55 PM)  O2 Flow:  Room air CC: Acute visit-chest congestion(tightness), cough-dry, SOB with activity, ? fever/chills.   Physical Exam  Additional Exam:  General: A/Ox3; pleasant and cooperative, NAD, SKIN: no rash, lesions NODES: no lymphadenopathy HEENT: [place]/AT, EOM- WNL, Conjuctivae- clear, PERRLA, TM-WNL, Nose- clear, Throat- clear. Mallampati  II NECK: Supple w/ fair ROM, JVD- none, normal carotid impulses w/o bruits Thyroid- CHEST: Minor dry cough, some wheeze. HEART: RRR slow, trace systolic murumur at aortic space ABDOMEN: soft and NT  ZOX:WRUE, nl pulses, no edema under elastic hose. NEURO: Grossly intact to observation      Impression & Recommendations:  Problem # 1:  DEEP VENOUS THROMBOPHLEBITIS, HX OF (ICD-V12.52)  Doubt recent PE. Her latest coags were actually long from antibiotic effect on coumadin. Her updated medication list for this problem includes:    Coumadin 3 Mg Tabs (Warfarin sodium) .Marland Kitchen... Take 1 by mouth once daily  Problem # 2:  ASTHMATIC BRONCHITIS, ACUTE (ICD-466.0)  Acute exacerbation of COPD with bronchitis. Will give prednisone and repeat antibiotic.  The following medications were removed from the medication list:    Clarithromycin 500 Mg  Tabs (Clarithromycin) .Marland Kitchen... 1 twice daily after meals may affect coumadin Her updated medication list for this problem includes:    Singulair 10 Mg Tabs (Montelukast sodium) ..... Once daily    Pulmicort 0.25 Mg/33ml Susp (Budesonide) .Marland Kitchen..Marland Kitchen Two times a day    Albuterol Sulfate (2.5 Mg/53ml) 0.083% Nebu (Albuterol sulfate) .Marland Kitchen... 1 neb qid as needed    Proventil Hfa 108 (90 Base) Mcg/act Aers (Albuterol sulfate) .Marland Kitchen... 2 puffs four times a day as needed    Spiriva Handihaler 18 Mcg Caps (Tiotropium bromide monohydrate) .Marland Kitchen... Take once daily    Zithromax Z-pak 250 Mg Tabs (Azithromycin) .Marland Kitchen... 2 today then one daily  Problem # 3:  ESOPHAGEAL REFLUX (ICD-530.81) Considered aspiration as explanation and reminded her of reflux precautions.  Her updated medication list for this problem includes:    Prevacid 15 Mg Cpdr (Lansoprazole) .Marland Kitchen... 1 each evening    Omeprazole 20 Mg Cpdr (Omeprazole) .Marland Kitchen... 1 each morning  Medications Added to Medication List This Visit: 1)  Zithromax Z-pak 250 Mg Tabs (Azithromycin) .... 2 today then one daily 2)  Prednisone 10 Mg Tabs (Prednisone) .Marland Kitchen.. 1 tab four times daily x 2 days, 3 times daily x 2 days, 2 times daily x 2 days, 1  time daily x 2 days  Other Orders: Est. Patient Level IV (69678)  Patient Instructions: 1)  Keep scheduled appointment 2)  Scripts sent to drug store for prednsione taper and Z pak 3)  sample Singulair Prescriptions: PREDNISONE 10 MG TABS (PREDNISONE) 1 tab four times daily x 2 days, 3 times daily x 2 days, 2 times daily x 2 days, 1 time daily x 2 days  #20 x 0   Entered and Authorized by:   Waymon Budge MD   Signed by:   Waymon Budge MD on 05/06/2010   Method used:   Electronically to        [facility] (retail)       [address]       [place], [place]  [address]       Ph: [phone]       Fax: [phone]   RxID:   [id number] ZITHROMAX Z-PAK 250 MG TABS (AZITHROMYCIN) 2 today then one daily   #1 pak x 0   Entered and Authorized by:   Waymon Budge MD   Signed by:   Waymon Budge MD on 05/06/2010   Method used:   Electronically to        [facility] (retail)       [address]       [place], [place]  [address]       Ph: [phone]       Fax: [phone]   RxID:   [id number]

## 2010-08-04 NOTE — Letter (Signed)
Summary: [facility]  [address]   [place], [place] [address]   Phone: [phone]  Fax: [phone]    08/05/2009     To Whom it may concern:     This letter is to inform you that Wylene Simmer brought his grandmother in for an appt today. If any questions or conerns please call our office at [phone].         Sincerely,    Yomira Flitton,NP

## 2010-08-06 NOTE — Progress Notes (Signed)
Summary: nos appt  Phone Note Call from Patient   Caller: [email or web address]  Call For: young Summary of Call: Rsc nos from 1/24 to 2/24. Initial call taken by: Darletta Moll,  July 29, 2010 11:25 AM

## 2010-08-28 ENCOUNTER — Ambulatory Visit (INDEPENDENT_AMBULATORY_CARE_PROVIDER_SITE_OTHER): Payer: Medicare Other | Admitting: Internal Medicine

## 2010-08-28 ENCOUNTER — Encounter: Payer: Self-pay | Admitting: Internal Medicine

## 2010-08-28 DIAGNOSIS — Z8672 Personal history of thrombophlebitis: Secondary | ICD-10-CM

## 2010-08-28 DIAGNOSIS — J329 Chronic sinusitis, unspecified: Secondary | ICD-10-CM

## 2010-08-28 DIAGNOSIS — J209 Acute bronchitis, unspecified: Secondary | ICD-10-CM

## 2010-09-10 NOTE — Assessment & Plan Note (Signed)
Summary: return office visit   Primary Provider/Referring Provider:  Ashish Shah/ [place]  CC:  Follow up visit-asthma and allergies; nadal drainage x 1.5 weeks, fatigued, and SOB with Rest and activity; facial pressure.Marland Kitchen  History of Present Illness:  March 18, 2010- Rhinosinusitis, allergic rhinitis, asthma/ bronchitis, hx DVT Acute visit- gradually worsening nasal congestion, frontal headache, not blowing anything out, or scant stringy/ bloody and some postnasal drip. No fever. She never got around to trying a Neti pot. Blames ragweed now.  Also told her primary doctor that she was waking a lot at night. He suggested her breathing was the problem and got an ONOX ( 18 minutes < 88%) and got her home O2  sleep at 2 L.   May 06, 2010- Rhinosinusitis, allergic rhinitis, asthma/ bronchitis, hx DVT Nurse-CC: Acute visit-chest congestion(tightness), cough-dry, SOB with activity, ? fever/chills. Acute visit as above. She fought off a bronchitis with chills and fever 3 weeks ago, nasal congestion. Took an antibiotic. Current episode began about 4 days ago. Began needing more neb trreatments for dyspnea. Taking care of grandaughter. Yesterday got very SOB pushing shopping cart. Today easy DOE, hoarse. Not coughing productively and no fever. Not swelling worse than usual and no chest pain.   August 28, 2010-  Rhinosinusitis, allergic rhinitis, asthma/ bronchitis, hx DVT Nurse-CC:  Rhinosinusitis, allergic rhinitis, asthma/ bronchitis, hx DVT Feels tired, short of breath, no energy x 2-3 months. No distinct onset.. Some good days. Weather matters. Now 1-2 weeks green nasal drainage. Saw ENT in [place] treated 4 weeks antibiotic, then told she needed surgery but not a good candidate for surgery, so nothing more to be done. Has not seen her PCP about this- due next week for her routine PT draw/ coumadin f/u. Legs swelling more- on diuretic.  Denies chest pain, palpitation, blood loss, fever, weight  loss.  Hosp at [facility] in December for dizziness. Had doppler leg veins again 2 months ago.     Preventive Screening-Counseling & Management  Alcohol-Tobacco     Smoking Status: never  Current Medications (verified): 1)  Singulair 10 Mg  Tabs (Montelukast Sodium) .... Once Daily 2)  Pulmicort 0.25 Mg/48ml  Susp (Budesonide) .... Two Times A Day 3)  Albuterol Sulfate (2.5 Mg/62ml) 0.083% Nebu (Albuterol Sulfate) .Marland Kitchen.. 1 Neb Qid As Needed 4)  Proventil Hfa 108 (90 Base) Mcg/act Aers (Albuterol Sulfate) .... 2 Puffs Four Times A Day As Needed 5)  Fluticasone Propionate 50 Mcg/act Susp (Fluticasone Propionate) .Marland Kitchen.. 1 Spray in Each Nostril Once Daily 6)  Lipitor 40 Mg Tabs (Atorvastatin Calcium) .... Take 1 By Mouth Once Daily 7)  Norvasc 10 Mg Tabs (Amlodipine Besylate) .... Take 1 By Mouth Once Daily 8)  Metoprolol Succinate 25 Mg Xr24h-Tab (Metoprolol Succinate) .Marland Kitchen.. 1 Daily 9)  Spiriva Handihaler 18 Mcg Caps (Tiotropium Bromide Monohydrate) .... Take Once Daily 10)  Antivert 25 Mg Tabs (Meclizine Hcl) .... Take 1/2tablet Three Times A Day By Mouth 11)  Klor-Con M20 20 Meq Cr-Tabs (Potassium Chloride Crys Cr) .... Take 1 Tablet Once Daily By Mouth 12)  Furosemide 20 Mg Tabs (Furosemide) .... Take 1 Tablet By Mouth Once A Day 13)  Zyrtec Hives Relief 10 Mg Tabs (Cetirizine Hcl) .... Take 1  Every Morning 14)  Prevacid 15 Mg Cpdr (Lansoprazole) .Marland Kitchen.. 1 Each Evening 15)  Omeprazole 20 Mg Cpdr (Omeprazole) .Marland Kitchen.. 1 Each Morning 16)  Coumadin 3 Mg Tabs (Warfarin Sodium) .... Take 1 By Mouth Once Daily 17)  Alprazolam 0.25 Mg Tabs (  Alprazolam) .... Take 1 By Mouth At Bedtime 18)  O2 For Sleep 2l/m .... Dr Sherril Croon  Allergies (verified): 1)  ! Pcn  Past History:  Past Medical History: Last updated: 09/08/2007 Asthmatic bronchitis Allergic rhinitis DVT/ chronic coumadin nasal polyposis  Past Surgical History: Last updated: 2009/09/10 Total Abdominal  Hysterectomy Tonsillectomy Cholecystectomy Foot surgery "stomach burst" 1993  Family History: Last updated: 09/10/2009 Mother- died bone cancer Father- died heart, peripheral artery disease  Social History: Last updated: Sep 10, 2009 Patient never smoked.  Divorced she lives with daughter(military) to care for grandchildren in [place]  Risk Factors: Smoking Status: never (08/28/2010)  Review of Systems      See HPI       The patient complains of weight gain, dyspnea on exertion, and peripheral edema.  The patient denies anorexia, fever, weight loss, vision loss, decreased hearing, hoarseness, chest pain, syncope, headaches, hemoptysis, abdominal pain, melena, hematochezia, and severe indigestion/heartburn.    Vital Signs:  Patient profile:   77 year old female Height:      69 inches Weight:      178.38 pounds BMI:     26.44 O2 Sat:      95 % on Room air Pulse rate:   93 / minute BP sitting:   112 / 62  (left arm) Cuff size:   regular  Vitals Entered By: Reynaldo Minium CMA (August 28, 2010 12:07 PM)  O2 Flow:  Room air CC: Follow up visit-asthma and allergies; nadal drainage x 1.5 weeks, fatigued, SOB with Rest and activity; facial pressure.   Physical Exam  Additional Exam:  General: A/Ox3; pleasant and cooperative, NAD, SKIN: no rash, lesions NODES: no lymphadenopathy HEENT: [place]/AT, EOM- WNL, Conjuctivae- clear, PERRLA, TM-WNL, Nose- clear, Throat- clear. Mallampati  II. no postnasal drip seen NECK: Supple w/ fair ROM, JVD- none, normal carotid impulses w/o bruits Thyroid- CHEST: light cough, some wheeze left back, no rales or rhonchi.Marland Kitchen HEART: RRR slow, trace systolic murumur at aortic space ABDOMEN: soft and NT  ZOX:WRUE, nl pulses, no edema under elastic hose. NEURO: Grossly intact to observation      Impression & Recommendations:  Problem # 1:  ASTHMATIC BRONCHITIS, ACUTE (ICD-466.0)  We can treat for some exacerbation of bronchitis with wheeze by  giving a little prednisone.  The following medications were removed from the medication list:    Zithromax Z-pak 250 Mg Tabs (Azithromycin) .Marland Kitchen... 2 today then one daily Her updated medication list for this problem includes:    Singulair 10 Mg Tabs (Montelukast sodium) ..... Once daily    Pulmicort 0.25 Mg/87ml Susp (Budesonide) .Marland Kitchen..Marland Kitchen Two times a day    Albuterol Sulfate (2.5 Mg/27ml) 0.083% Nebu (Albuterol sulfate) .Marland Kitchen... 1 neb qid as needed    Proventil Hfa 108 (90 Base) Mcg/act Aers (Albuterol sulfate) .Marland Kitchen... 2 puffs four times a day as needed    Spiriva Handihaler 18 Mcg Caps (Tiotropium bromide monohydrate) .Marland Kitchen... Take once daily  Orders: Est. Patient Level III ([address])  Problem # 2:  SINUSITIS (ICD-473.9) Chronic sinusitis- note comment by her ENT. Prednisone might reduce swelling and help drainage.  Problem # 3:  DEEP VENOUS THROMBOPHLEBITIS, HX OF (ICD-V12.52)  On coumadin without obvious recurrence. I don' think she is anemic.  Her updated medication list for this problem includes:    Coumadin 3 Mg Tabs (Warfarin sodium) .Marland Kitchen... Take 1 by mouth once daily  Orders: Est. Patient Level III ([address])  Medications Added to Medication List This Visit: 1)  Fluticasone Propionate 50 Mcg/act Susp (  Fluticasone propionate) .Marland Kitchen.. 1 spray in each nostril once daily 2)  Prednisone 5 Mg Tabs (Prednisone) .... 2 daily x 1 week  then 1 daily x 1 week  Patient Instructions: 1)  Please schedule a follow-up appointment in 3 months. 2)  Script sent for light prednisone trial 3)  Let Dr Sherryll Burger know about your weak/ tired feeling and ask if it is time to check hemoglobin, potassium, thyroid  and perhaps other blood work since you are going to have blood drawn anyway for coumadin check. 4)  cc Dr Sherryll Burger Prescriptions: PREDNISONE 5 MG TABS (PREDNISONE) 2 daily x 1 week  then 1 daily x 1 week  #21 x 0   Entered and Authorized by:   Waymon Budge MD   Signed by:   Waymon Budge MD on 08/28/2010   Method  used:   Electronically to        [facility] (retail)       [address]       [place], [place]  [address]       Ph: [phone]       Fax: [phone]   RxID:   [id number]

## 2010-11-17 NOTE — Assessment & Plan Note (Signed)
[facility]                             PULMONARY OFFICE NOTE   Ebony, Farmer                     MRN:          [id number]  DATE:01/31/2007                            DOB:          12-11-1933    PULMONARY OFFICE FOLLOWUP   PROBLEM LIST:  1. Allergic rhinitis.  2. Asthmatic bronchitis.  3. Esophageal reflux.  4. History of recurrent deep vein thrombosis/chronic Coumadin.  5. Sinusitis.  6. Nasal polyposis.   HISTORY:  Gradually increasing chest congestion through this summer.  Humidity bothers her a lot.  She has not felt well in 2 weeks and has  had a couple of dreams where she felt smothered.  Occasionally aware of  reflux, but not much.  She never got a report from the CT scan done at  [facility].  Is coughing a little yellow to green.  She wants to delay  sinus surgery.   MEDICATIONS:  1. Coumadin.  2. Norvasc.  3. Atenolol.  4. Singulair.  5. Home nebulizer with Pulmicort 0.5 mg b.i.d.  6. Mucinex p.r.n.  7. Saline nasal lavage.  8. Home nebulizer with albuterol q.i.d. p.r.n.   DRUG INTOLERANCES:  PENICILLIN.   OBJECTIVE:  Weight 184 pounds, BP 142/82, pulse 80, room air saturation  95%.  Mild hoarseness.  No thrush.  No stridor.  No neck vein distension.  She had some cough with no rales, rhonchi, or wheezing.   IMPRESSION:  Rhinitis and rhinosinusitis.  Bronchitic exacerbation of  chronic obstructive pulmonary disease.   PLAN:  1. Biaxin 500 mg b.i.d. for 10 days.  2. Depo-Medrol 80 mg IM.  3. Steroid talk.  4. Keep scheduled appointment.  Earlier p.r.n.     Clinton D. Maple Hudson, MD, Tonny Bollman, FACP  Electronically Signed    CDY/MedQ  DD: 01/31/2007  DT: 02/01/2007  Job #: [id number]   cc:   Kirstie Peri, MD

## 2010-11-17 NOTE — Assessment & Plan Note (Signed)
[facility]                             PULMONARY OFFICE NOTE   TYLER, ROBIDOUX                     MRN:          [id number]  DATE:05/02/2007                            DOB:          30-Dec-1933    PROBLEM LIST:  1. Allergic rhinitis.  2. Asthmatic bronchitis.  3. Esophageal reflux.  4. History of recurrent deep venous thrombosis/chronic Coumadin.  5. Sinusitis.  6. Nasal polyposis.   HISTORY:  She shook off a bronchitis episode last week.  Sinuses stay  stuffy.  She remains off of allergy vaccine and off of Xolair which she  felt had helped her.  Nothing purulent now.  No fever, no headache or  sore throat.   MEDICATIONS:  Coumadin, Norvasc, atenolol, Singulair, home nebulizer  using Pulmicort 0.5 mg b.i.d., saline nasal spray, p.r.n. use of  albuterol by nebulizer, Tussionex, and Mucinex.   ALLERGIES:  PENICILLIN.   OBJECTIVE:  VITAL SIGNS:  Weight 178 pounds, blood pressure 122/70,  pulse 56, room air saturation 98%.  HEENT:  There is periorbital edema, narrow nasal air space bilaterally  with some mild turbinate edema, no polyps, no mucous, no postnasal  drainage.  Pharynx is a little reddened and she is mildly hoarse without  stridor or adenopathy.  CHEST:  Sounds clear.  HEART:  Pulse is regular without murmur heard.   IMPRESSION:  1. Rhinitis, question sinusitis.  2. Asthma/chronic obstructive pulmonary disease.   PLAN:  Saline nasal lavage making distinction from saline nasal spray  and print information given.  Flu vaccine discussed and given.  Schedule  return in four months, earlier p.r.n.     Clinton D. Maple Hudson, MD, Tonny Bollman, FACP  Electronically Signed    CDY/MedQ  DD: 05/03/2007  DT: 05/03/2007  Job #: [id number]   cc:   Kirstie Peri, MD

## 2010-11-19 ENCOUNTER — Encounter: Payer: Self-pay | Admitting: Internal Medicine

## 2010-11-20 NOTE — Discharge Summary (Signed)
. [facility]  Patient:    Ebony Farmer, Ebony Farmer Visit Number: [id number] MRN: [id number]          Service Type: MED Location: [id number] Attending Physician:  Bennye Alm Dictated by:   Adair Patter, P.A. Admit Date:  08/04/2001 Disc. Date: 08/08/01   CC:         Maudry Diego, M.D.   Discharge Summary  DATE OF BIRTH:  May 09, 1934  ADMITTING DIAGNOSIS:  Right leg deep venous thrombosis.  DISCHARGE DIAGNOSIS:  Right leg deep venous thrombosis.  HOSPITAL COURSE:  Ms. Csaszar was a patient who was seen and evaluated as an outpatient at the [facility] office.  On this day, by lower extremity venous duplex, she was found to have deep venous thrombosis of her right common femoral vein. Because of this, the patient was admitted to [facility] on August 04, 2001.  She was placed on IV heparin and started on p.o. Coumadin.  She was placed on bed rest and given adequate pain control measures.  No complications were noted during her hospital course.  Her INR was therapeutic by August 07, 2001, and was subsequently discharged home in stable condition on August 08, 2001, with therapeutic INR which is greater than 2.0.  DISCHARGE MEDICATIONS:  1. Tylox one to two tablets every four to six hours as needed for pain.  2. Protonix 40 mg one daily.  3. Coumadin, final dose will be dictated by her INR at the time of discharge.  4. Norvasc.  5. Atenolol.  6. Singulair.  7. Zyrtec.  8. MaxAir inhaler.  9. Advair inhaler. 10. Flonase inhaler.  ACTIVITY:  The patient was told to avoid driving while taking Tylox.  Avoid strenuous activity.  DIET:  Low fat, low salt.  DISPOSITION:  Discharged to home.  FOLLOWUP:  The patient was told to see Dr. [name] on Wednesday, February 12, at 8:40 a.m.  She was also told to have her INR drawn at her primary medical doctor, Dr. Gust Brooms office in two to three days. Dictated by:   Adair Patter,  P.A. Attending Physician:  Bennye Alm DD:  08/07/01 TD:  08/08/01 Job: [id number]

## 2010-11-20 NOTE — H&P (Signed)
[name]  Patient:    Ebony Farmer, Ebony Farmer Visit Number: [id number] MRN: [id number]          Service Type: MED Location: [id number] Attending Physician:  Bennye Alm Dictated by:   Dominica Severin, P.A. Admit Date:  08/04/2001   CC:         Dr. Charlyne Mom D. Maple Hudson, M.D.                         History and Physical  DATE OF BIRTH: 1933-10-23.  CHIEF COMPLAINT:  Right deep venous thrombosis.  HISTORY OF PRESENT ILLNESS:  This is a 77 year old Caucasian female who was seen in follow-up for right leg pain last week on July 26, 2001 by Dr. Di Kindle. Ebony Farmer.  She had previously been seen in 1999 and early 2000 for a tibial vein thrombus in the right leg, which had resolved, and she did not require anticoagulation.  She was then lost to follow-up.  She had presented last week with a two-week history of right calf pain which came on somewhat gradually, although she stated she had the calf pain on and off for the last year.  The pain was associated with swelling and soreness as well as redness.  The DVT that she had back in December 1999 was in a calf vein and involved only one of the two posterior tibial vein and one peroneal vein.  She has had no other history of DVT that she was aware of.  She returned today on August 04, 2001 and was seen in the office by Dr. Ellin Mayhew. [place] after having increasing symptoms.  A venous duplex was done in our office again today which showed a DVT in the right superficial femoral vein. Last week of note, there was no acute DVT but there appeared to be an old thrombus noted on the tibial peroneal trunk vein.  She denies any buttock, hip, thigh pain.  She does have calf pain as well as pain at night.  Denies any foot or rest pain.  She does not have any slow healing ulcers, gangrene, ischemic changes.  She does have some edema on her right side.  She denies any decreased  temperatures.  She has shortness of breath at times secondary to her asthma and she is currently wheezing secondary to her asthma.  She denies any history of chest pain, coronary artery disease, myocardial infarction, or congestive heart failure.  She also states that she does have a history of cardiac arrhythmia and occasionally feels some palpitations.  PAST MEDICAL HISTORY:  History of DVT.  Hypertension.  Hyperlipidemia. Osteoarthritis.  Asthma.  History of irregular heart beat.  PAST SURGICAL HISTORY:  Tonsils and adenoids.  History of several D&Cs.  Total abdominal hysterectomy.  Repair of perforated ulcer 10 years ago.  MEDICATIONS: 1. Norvasc 10 mg q.d. 2. Atenolol 12.5 mg p.o. b.i.d. 3. Singulair q.h.s. 4. Zyrtec p.r.n. 5. Maxair p.r.n. 2 puffs q.4h. 6. Advair inhaler 500/50 with 1 puff b.i.d. 7. Flonase 2 puffs q.d. each nostril p.r.n.  ALLERGIES:  She is allergic to PENICILLIN which causes swelling.  REVIEW OF SYSTEMS:  Please see HPI for significant positives.  Otherwise the patient denies any history of diabetes mellitus, kidney disease, or stroke. Of note, the patient did not have her exact medication list, although she told me she remembered her  dosages and she is to bring her medications to the hospital upon admission to cross reference this list above.  FAMILY HISTORY:  Mother is deceased at 26 from bone cancer.  Father is deceased at 47 from coronary artery disease, diabetes, and hypertension.  She has sisters and brothers who have a history of irregular heart beat, diabetes, and a history of DVT in the family.  SOCIAL HISTORY:  She is divorced.  Lives in [place], [place].  She has four children.  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  She denies any alcohol or tobacco use ever.  PHYSICAL EXAMINATION:  VITAL SIGNS:  Blood pressure is 164/90, pulse 72, respirations 18.  GENERAL:  This is a 77 year old Caucasian female in no acute distress.  She  is alert and oriented x 3.  HEENT:  Head is normocephalic and atraumatic.  Eyes are PERRLA/EOMI without cataracts, glaucoma, or macular degeneration.  She does wear glasses.  NECK:  Supple without JVD, bruits, or lymphadenopathy.  CHEST:  Symmetrical on inspiration.  She does have some inspiratory wheezes. Denies any pleuritic pain.  She does not have any rhonchi, rales, or lymphadenopathy.  HEART:  Regular rate and rhythm without murmurs, rubs, or gallops.  ABDOMEN:  Soft and nontender with bowel sounds in four quadrants without masses or bruits.  GENITOURINARY:  Deferred.  RECTAL:  Deferred.  EXTREMITIES:  Without clubbing or cyanosis.  She has edema on the right.  Her right lower extremity has an increased temperature than the left.  She does not have any ulcerations.  PULSES:  Her carotid, femoral, popliteal, and dorsalis pedis pulses are 2+ bilaterally.  She does not have any focal deficit.  NEUROLOGICAL:  She has a steady gait.  Deep tendon reflexes and muscle strength are 2+ bilaterally.  ASSESSMENT:  Right superficial femoral vein deep venous thrombosis which is acute:  She is to be admitted for intravenous anticoagulation and bed rest. Dr. Caralee Ates has seen and evaluated this patient prior to this admission and the patient will be admitted to Dr. Di Kindle. Ebony Farmer.  She will be anticoagulated.  She understands the risks of anticoagulation with her history of a perforated ulcer but she agrees to proceed with the anticoagulation therapy.Dictated by:   Dominica Severin, P.A. Attending Physician:  Bennye Alm DD:  08/04/01 TD:  08/04/01 Job: [id number]

## 2010-11-20 NOTE — Assessment & Plan Note (Signed)
[facility]                               PULMONARY OFFICE NOTE   Ebony Farmer, Ebony Farmer                     MRN:          [id number]  DATE:04/18/2006                            DOB:          02/25/1934    PULMONARY FOLLOWUP   PROBLEMS:  1. Allergic rhinitis.  2. Asthmatic bronchitis.  3. Esophageal reflux.  4. History of recurrent deep vein thrombosis/chronic Coumadin.  5. Sinusitis.  6. Nasal polyposis.   HISTORY:  She says she is doing well, especially compared with last year at  this time.  Dr. Sherryll Burger treated sinusitis about 2 months ago.  She never did  follow up with Dr. Annalee Genta about surgery.  She is using her nasal saline  and Nasonex b.i.d. every day.  She has had no bleeding and says her  reluctance for surgery partly is because she does not want to come off of  her Coumadin for it.  She does have persistent nasal congestion without  headache or purulent discharge.  The cough, she says, is not bad, not  productive.  We had given a standby prednisone taper to hold in June, and  she does not remember if she took it, but she asks another one to have with  her when she goes on her trip.  No recent need for albuterol.   MEDICATIONS:  Coumadin.  Norvasc.  Atenolol.  Prevacid.  Singulair.  Nasonex.  Nebulizer using Pulmicort b.i.d.  Saline nasal spray.  Nebulizer  with albuterol 1 to 2 times daily when needed.  Tussionex.  Maxair rescue  inhaler.  Entex PSE.   DRUG INTOLERANCES:  PENICILLIN.   OBJECTIVE:  Weight 185 pounds, BP 132/70, pulse regular 64, room air  saturation 96%.  She looks comfortable.  Nasal mucosa is red, not especially edematous, and I cannot see polyps  today.  She does not sound especially stuffy.  Her pharynx is clear.  There is no neck vein distension.  HEART:  Sounds are regular.  I do not hear murmur or gallop.  LUNGS:  The lung fields are quiet without rales, wheeze, rhonchi, or  increased work of breathing.   There is no peripheral edema.   IMPRESSION:  1. Stable asthma/chronic obstructive pulmonary disease.  2. Perennial rhinitis with history of rhinosinusitis and polyposis.   PLAN:  We refilled her Nasonex and I did, after discussion, give her a  prednisone 8-day taper from 40 mg to hold for security when she goes on her  trip.  Pneumococcal vaccine booster was  given (she had had a previous at least 5 years ago).  Flu vaccine.  Schedule  return in 6 months, earlier p.r.n.  Chest x-ray today.       Clinton D. Maple Hudson, MD, [facility], FACP      CDY/MedQ  DD:  04/18/2006  DT:  04/19/2006  Job #:  [id number]   cc:   Kirstie Peri, MD  Kinnie Scales. Annalee Genta, M.D.

## 2010-11-20 NOTE — Assessment & Plan Note (Signed)
[facility]                             PULMONARY OFFICE NOTE   DONELLA, PASCARELLA                     MRN:          [id number]  DATE:10/18/2006                            DOB:          1933/09/14    PROBLEM:  1. Allergic rhinitis.  2. Asthmatic bronchitis.  3. Esophageal reflux.  4. History of recurrent deep vein thrombosis/chronic Coumadin.  5. Sinusitis.  6. Nasal polyposis.   She was in the [facility] Emergency Room before Easter with exacerbation  of bronchitis and sinusitis.  Chest x-ray reportedly was abnormal, so  she got a chest CT at [facility] yesterday.  She brings a disc which just  contains the bone density images and no report. I cannot tell if there  was a parenchymal abnormality or not and we have asked her to authorize  [facility] to release the radiology report.  She has had some recurrent  diffuse sharp right-sided back pains, which sound as if they may  represent some arthritic nerve root irritation.  There has not been a  rash or fever.   MEDICATIONS:  Coumadin, Norvasc, atenolol, Prevacid, Singulair,  Pulmicort by nebulizer b.i.d., Mucinex, saline nasal spray, home  nebulizer with albuterol when needed, Maxair rescue inhaler when needed.   DRUG INTOLERANT:  To PENICILLIN.   OBJECTIVE:  Weight 182 pounds, blood pressure 122/80, pulse regular 64.  Room air saturation 99%.  Trace wheeze heard only briefly at the left scapula, otherwise lung  fields quiet and unlabored.  I find no rash on her back.  There is no adenopathy.  No pleural rub.  Heart sounds are regular without murmur.  No edema.   IMPRESSION:  1. Asthma/chronic obstructive pulmonary disease with recent      exacerbation, now largely resolved.  2. Back pain, most likely musculoskeletal.   PLAN:  1. Get report from [facility] chest CT for our records.  2. Schedule return in 6 months, earlier p.r.n.  3. Continue walking for endurance.     Clinton D.  Maple Hudson, MD, [facility], FACP  Electronically Signed    CDY/MedQ  DD: 10/18/2006  DT: 10/19/2006  Job #: [id number]   cc:   Kirstie Peri, MD  Kinnie Scales. Annalee Genta, M.D.

## 2010-11-26 ENCOUNTER — Ambulatory Visit (INDEPENDENT_AMBULATORY_CARE_PROVIDER_SITE_OTHER): Payer: Medicare Other | Admitting: Internal Medicine

## 2010-11-26 ENCOUNTER — Encounter: Payer: Self-pay | Admitting: Internal Medicine

## 2010-11-26 DIAGNOSIS — J4489 Other specified chronic obstructive pulmonary disease: Secondary | ICD-10-CM | POA: Insufficient documentation

## 2010-11-26 DIAGNOSIS — J329 Chronic sinusitis, unspecified: Secondary | ICD-10-CM

## 2010-11-26 DIAGNOSIS — J42 Unspecified chronic bronchitis: Secondary | ICD-10-CM

## 2010-11-26 DIAGNOSIS — Z8672 Personal history of thrombophlebitis: Secondary | ICD-10-CM

## 2010-11-26 DIAGNOSIS — J449 Chronic obstructive pulmonary disease, unspecified: Secondary | ICD-10-CM | POA: Insufficient documentation

## 2010-11-26 NOTE — Assessment & Plan Note (Signed)
This is at her baseline. She remains O2 dependent at night and needs daily nebulized meds. Within these limits, she paces herself.

## 2010-11-26 NOTE — Patient Instructions (Signed)
Please call as needed 

## 2010-11-26 NOTE — Progress Notes (Signed)
  Subjective:    Patient ID: Ebony Farmer, female    DOB: 1934-02-06, 77 y.o.   MRN: [id number]  HPI 11/26/10- 76 yoFnever smoker, followed for asthmatic bronchitis, hx sinusitis and DVT, complicated by GERD Last here August 28, 2010.- Note reviewed She had taken a long antibiotic course for sinusitis.  Since last here she says she has done pretty well, with no injections or antibiotics. She paces herself more carefully now. Had to close house against early Spring pollen. Fluticasone does help.  DOE varies- somedays can't walk to daughter's house. [facility] Uses nebulizer 4 x daily, plus 2 pulmicort treatments.  Productive cough varies- usually white. May get green from sinuses.  Review of Systems. Constitutional:   No weight loss, night sweats,  Fevers, chills, fatigue, lassitude. HEENT:   No headaches,  Difficulty swallowing,  Tooth/dental problems,  Sore throat,                No sneezing, itching, ear ache, nasal congestion, post nasal drip,   CV:  No chest pain,  Orthopnea, PND, swelling in lower extremities, anasarca, dizziness, palpitations  GI  No heartburn, indigestion, abdominal pain, nausea, vomiting, diarrhea, change in bowel habits, loss of appetite  Resp: No coughing up of blood.  No change in color of mucus.  No wheezing.   Skin: no rash or lesions.  GU: no dysuria, change in color of urine, no urgency or frequency.  No flank pain.  MS:  No joint pain or swelling.  No decreased range of motion.  No back pain.  Psych:  No change in mood or affect. No depression or anxiety.  No memory loss.       Objective:   Physical Exam General- Alert, Oriented, Affect-appropriate, Distress- none acute   On room air  Skin- rash-none, lesions- none, excoriation- none  Lymphadenopathy- none  Head- atraumatic  Eyes- Gross vision intact, PERRLA, conjunctivae clear secretions  Ears- Hearing, canals, Tm - normal  Nose- Clear, No -Septal dev, mucus,  polyps, erosion, perforation   Throat- Mallampati II , mucosa clear , drainage- none, tonsils- atrophic  Neck- flexible , trachea midline, no stridor , thyroid nl, carotid no bruit  Chest - symmetrical excursion , unlabored     Heart/CV- RRR , no murmur , no gallop  , no rub, nl s1 s2                     - JVD- none , edema- none, stasis changes- none, varices- none     Lung- clear to P&A, wheeze- none, cough- none , dullness-none, rub- none     Chest wall-  Abd- tender-no, distended-no, bowel sounds-present, HSM- no  Br/ Gen/ Rectal- Not done, not indicated  Extrem- cyanosis- none, clubbing, none, atrophy- none, strength- nl  Neuro- grossly intact to observation         Assessment & Plan:

## 2010-11-26 NOTE — Assessment & Plan Note (Signed)
Not actively symptomatic.

## 2010-11-30 NOTE — Assessment & Plan Note (Signed)
No recurrence. 

## 2011-03-02 ENCOUNTER — Encounter: Payer: Self-pay | Admitting: Internal Medicine

## 2011-03-02 ENCOUNTER — Ambulatory Visit (INDEPENDENT_AMBULATORY_CARE_PROVIDER_SITE_OTHER): Payer: Medicare Other | Admitting: Internal Medicine

## 2011-03-02 VITALS — BP 130/82 | HR 74 | Ht 69.0 in | Wt 178.6 lb

## 2011-03-02 DIAGNOSIS — J330 Polyp of nasal cavity: Secondary | ICD-10-CM

## 2011-03-02 DIAGNOSIS — Z8672 Personal history of thrombophlebitis: Secondary | ICD-10-CM

## 2011-03-02 DIAGNOSIS — J209 Acute bronchitis, unspecified: Secondary | ICD-10-CM

## 2011-03-02 MED ORDER — AZITHROMYCIN 250 MG PO TABS: ORAL_TABLET | ORAL | Status: AC

## 2011-03-02 MED ORDER — METHYLPREDNISOLONE ACETATE 80 MG/ML IJ SUSP
80.0000 mg | Freq: Once | INTRAMUSCULAR | Status: AC
  Administered 2011-03-02: 80 mg via INTRAMUSCULAR

## 2011-03-02 NOTE — Assessment & Plan Note (Signed)
Acute viral pattern URI- she is not wheezing much yet. We discussed supportive care and will give depo and a script for antibiotic to hold

## 2011-03-02 NOTE — Progress Notes (Signed)
Subjective:    Patient ID: Ebony Farmer, female    DOB: 04/18/1934, 77 y.o.   MRN: [id number]  HPI 03/02/11-  Subjective:    Patient ID: Ebony Farmer, female    DOB: [birth year], 77 y.o.   MRN: [id number]  HPI 11/26/10- 77 yoFnever smoker, followed for asthmatic bronchitis, hx sinusitis and DVT, complicated by GERD Last here August 28, 2010.- Note reviewed She had taken a long antibiotic course for sinusitis.  Since last here she says she has done pretty well, with no injections or antibiotics. She paces herself more carefully now. Had to close house against early Spring pollen. Fluticasone does help.  DOE varies- somedays can't walk to daughter's house. [facility] Uses nebulizer 4 x daily, plus 2 pulmicort treatments.  Productive cough varies- usually white. May get green from sinuses.   03/02/11- 77 yoFnever smoker, followed for asthmatic bronchitis, hx sinusitis and DVT, complicated by GERD 3-4 days malaise she indicates she may have caught from new infant in family. She got short of breath at store, came home and napped with her oxygen . Today feels better, just no energy. Sinus pressure, sore throat. Feels tighter in chest, some wheeze and dry cough. Denies chest pain, swelling or leg pain.  Dr Clelia Croft did overnight oximetry to se if she still needed O2 and called her to say she Did still need O2 for sleep.   Review of Systems. Constitutional:   No weight loss, night sweats,  Fevers, chills, fatigue, lassitude. HEENT:   No headaches,  Difficulty swallowing,  Tooth/dental problems,  Sore throat,                No sneezing, itching, ear ache, nasal congestion, post nasal drip,  CV:  No chest pain,  Orthopnea, PND, swelling in lower extremities, anasarca, dizziness, palpitations GI  No heartburn, indigestion, abdominal pain, nausea, vomiting, diarrhea, change in bowel habits, loss of appetite Resp: No coughing up of blood.  No change in color of mucus.  No  wheezing.  Skin: no rash or lesions. GU: no dysuria, change in color of urine, no urgency or frequency.  No flank pain. MS:  No joint pain or swelling.  No decreased range of motion.  No back pain. Psych:  No change in mood or affect. No depression or anxiety.  No memory loss.  Objective:   Physical Exam General- Alert, Oriented, Affect-appropriate, Distress- none acute Skin- rash-none, lesions- none, excoriation- none Lymphadenopathy- none Head- atraumatic            Eyes- Gross vision intact, PERRLA, conjunctivae clear secretions            Ears- Hearing, canals-normal            Nose- Clear, no-Septal dev, mucus, polyps, erosion, perforation             Throat- Mallampati II , mucosa clear , drainage- none, tonsils- atrophic Neck- flexible , trachea midline, no stridor , thyroid nl, carotid no bruit Chest - symmetrical excursion , unlabored           Heart/CV- RRR , no murmur , no gallop  , no rub, nl s1 s2                           - JVD- none , edema- trace, stasis changes- none, varices- none           Lung- clear to P&A, wheeze-  none, cough- none , dullness-none, rub- none           Chest wall-  Abd- tender-no, distended-no, bowel sounds-present, HSM- no Br/ Gen/ Rectal- Not done, not indicated Extrem- cyanosis- none, clubbing, none, atrophy- none, strength- nl Neuro- grossly intact to observation           Assessment & Plan:     Review of Systems     Objective:   Physical Exam        Assessment & Plan:

## 2011-03-02 NOTE — Patient Instructions (Signed)
Script for Zpak to hold in case you get sicker. It may affect your coumadin  Depo 80

## 2011-03-06 NOTE — Assessment & Plan Note (Signed)
Old peripheral venous insufficiency.

## 2011-03-06 NOTE — Assessment & Plan Note (Signed)
No recurrence of polyp seen on current exam.

## 2011-03-29 ENCOUNTER — Ambulatory Visit: Payer: Medicare Other | Admitting: Internal Medicine

## 2011-04-05 ENCOUNTER — Encounter: Payer: Self-pay | Admitting: Internal Medicine

## 2011-04-05 ENCOUNTER — Ambulatory Visit (INDEPENDENT_AMBULATORY_CARE_PROVIDER_SITE_OTHER): Payer: Medicare Other | Admitting: Internal Medicine

## 2011-04-05 VITALS — BP 138/86 | HR 76 | Ht 66.0 in | Wt 180.4 lb

## 2011-04-05 DIAGNOSIS — J42 Unspecified chronic bronchitis: Secondary | ICD-10-CM

## 2011-04-05 DIAGNOSIS — Z23 Encounter for immunization: Secondary | ICD-10-CM

## 2011-04-05 NOTE — Assessment & Plan Note (Addendum)
Controlled COPD. I think her exercise tolerance is about what I would expect. We discussed exercise for endurance. Continues O2 for sleep Flu vaccine

## 2011-04-05 NOTE — Patient Instructions (Signed)
Flu vax  Continue as you are doing. Please call as needed

## 2011-04-05 NOTE — Progress Notes (Signed)
Patient ID: Ebony Farmer, female    DOB: 1934-03-01, 77 y.o.   MRN: [id number]  HPI 11/26/10- 76 yoFnever smoker, followed for asthmatic bronchitis, hx sinusitis and DVT, complicated by GERD Last here August 28, 2010.- Note reviewed She had taken a long antibiotic course for sinusitis.  Since last here she says she has done pretty well, with no injections or antibiotics. She paces herself more carefully now. Had to close house against early Spring pollen. Fluticasone does help.  DOE varies- somedays can't walk to daughter's house. [facility] Uses nebulizer 4 x daily, plus 2 pulmicort treatments.  Productive cough varies- usually white. May get green from sinuses.   03/02/11- 76 yoFnever smoker, followed for asthmatic bronchitis, hx sinusitis and DVT, complicated by GERD 3-4 days malaise she indicates she may have caught from new infant in family. She got short of breath at store, came home and napped with her oxygen . Today feels better, just no energy. Sinus pressure, sore throat. Feels tighter in chest, some wheeze and dry cough. Denies chest pain, swelling or leg pain.  Dr Clelia Croft did overnight oximetry to se if she still needed O2 and called her to say she Did still need O2 for sleep.   04/05/11- 76 yoFnever smoker, followed for asthmatic bronchitis, hx sinusitis and DVT, complicated by GERD Needs flu vax has had more than one Pneumovax.  Breathing seems good to her, with dyspnea on hills and stairs.. Still uses O2 (1.5 L per minute/ [facility])at night per Dr Clelia Croft. She takes prednisone only as needed now.  Review of Systems. Constitutional:   No weight loss, night sweats,  Fevers, chills, fatigue, lassitude. HEENT:   No headaches,  Difficulty swallowing,  Tooth/dental problems,  Sore throat,                No sneezing, itching, ear ache, nasal congestion, post nasal drip,  CV:  No chest pain, orthopnea, PND, swelling in lower extremities, anasarca, dizziness,  palpitations GI  No heartburn, indigestion, abdominal pain, nausea, vomiting, diarrhea, change in bowel habits, loss of appetite Resp: No coughing up of blood.  No change in color of mucus.  No wheezing.  Skin: no rash or lesions. GU: no dysuria, change in color of urine, no urgency or frequency.  No flank pain. MS:  No joint pain or swelling.  No decreased range of motion.  No back pain. Psych:  No change in mood or affect. No depression or anxiety.  No memory loss.  Objective:   Physical Exam General- Alert, Oriented, Affect-appropriate, Distress- none acute Skin- rash-none, lesions- none, excoriation- none Lymphadenopathy- none Head- atraumatic            Eyes- Gross vision intact, PERRLA, conjunctivae clear secretions            Ears- Hearing, canals-normal            Nose- Clear, no-Septal dev, mucus, polyps, erosion, perforation             Throat- Mallampati II , mucosa clear , drainage- none, tonsils- atrophic Neck- flexible , trachea midline, no stridor , thyroid nl, carotid no bruit Chest - symmetrical excursion , unlabored           Heart/CV- RRR , 1-2/6 syst murmur LUSB , no gallop  , no rub, nl s1 s2                           -  JVD- none , +edema- trace, stasis changes- none, varices- none           Lung- decreased breath sounds with few crackles, wheeze- none, cough- none , dullness-none, rub- none           Chest wall-  Abd- tender-no, distended-no, bowel sounds-present, HSM- no Br/ Gen/ Rectal- Not done, not indicated Extrem- cyanosis- none, clubbing, none, atrophy- none, strength- nl Neuro- grossly intact to observation           Assessment & Plan:     Review of Systems     Objective:   Physical Exam        Assessment & Plan:

## 2011-04-28 ENCOUNTER — Telehealth: Payer: Self-pay | Admitting: Internal Medicine

## 2011-04-28 NOTE — Telephone Encounter (Signed)
Called and spoke with pt. Pt states she has been having increased sob that has gradually gotten worse over last several days.  She is using alb neb approx 6 x daily.   Pt also c/o non productive cough and tightness in chest.  Pt requests appt.  Pt scheduled to see Cy on Friday 04/30/11 at 10:15 am but i did instruct pt to seek emergency care in the meantime if she felt her symptoms worsened and she needed immediate attention.  Pt verbalized understanding.

## 2011-04-30 ENCOUNTER — Ambulatory Visit (INDEPENDENT_AMBULATORY_CARE_PROVIDER_SITE_OTHER)
Admission: RE | Admit: 2011-04-30 | Discharge: 2011-04-30 | Disposition: A | Discharge status: Home / Self Care | Payer: Medicare Other | Source: Ambulatory Visit | Attending: Internal Medicine | Admitting: Internal Medicine

## 2011-04-30 ENCOUNTER — Encounter: Payer: Self-pay | Admitting: Internal Medicine

## 2011-04-30 ENCOUNTER — Ambulatory Visit (INDEPENDENT_AMBULATORY_CARE_PROVIDER_SITE_OTHER): Payer: Medicare Other | Admitting: Internal Medicine

## 2011-04-30 VITALS — BP 138/80 | HR 83 | Ht 67.0 in | Wt 179.0 lb

## 2011-04-30 DIAGNOSIS — J40 Bronchitis, not specified as acute or chronic: Secondary | ICD-10-CM

## 2011-04-30 DIAGNOSIS — J42 Unspecified chronic bronchitis: Secondary | ICD-10-CM

## 2011-04-30 DIAGNOSIS — R0600 Dyspnea, unspecified: Secondary | ICD-10-CM

## 2011-04-30 DIAGNOSIS — R0609 Other forms of dyspnea: Secondary | ICD-10-CM

## 2011-04-30 DIAGNOSIS — R0989 Other specified symptoms and signs involving the circulatory and respiratory systems: Secondary | ICD-10-CM

## 2011-04-30 MED ORDER — METHYLPREDNISOLONE ACETATE 80 MG/ML IJ SUSP
80.0000 mg | Freq: Once | INTRAMUSCULAR | Status: AC
  Administered 2011-04-30: 80 mg via INTRAMUSCULAR

## 2011-04-30 NOTE — Assessment & Plan Note (Signed)
Increased dyspnea with exertion, very nonspecific. She is inclined to blame to be fall weather. Nothing directly points to heart or venous thromboembolism problems. We will check appropriate labs and assess her home oxygen status for consideration of portable oxygen. Plan-CBC, d-dimer, BNP, CXR. Home O2 eval rest and exercise.

## 2011-04-30 NOTE — Progress Notes (Signed)
Patient ID: Ebony Farmer, female    DOB: 06-22-1934, 77 y.o.   MRN: [id number]  HPI 11/26/10- 77 yoFnever smoker, followed for asthmatic bronchitis, hx sinusitis and DVT, complicated by GERD Last here August 28, 2010.- Note reviewed She had taken a long antibiotic course for sinusitis.  Since last here she says she has done pretty well, with no injections or antibiotics. She paces herself more carefully now. Had to close house against early Spring pollen. Fluticasone does help.  DOE varies- somedays can't walk to daughter's house. [facility] Uses nebulizer 4 x daily, plus 2 pulmicort treatments.  Productive cough varies- usually white. May get green from sinuses.   03/02/11- 77 yoFnever smoker, followed for asthmatic bronchitis, hx sinusitis and DVT, complicated by GERD 3-4 days malaise she indicates she may have caught from new infant in family. She got short of breath at store, came home and napped with her oxygen . Today feels better, just no energy. Sinus pressure, sore throat. Feels tighter in chest, some wheeze and dry cough. Denies chest pain, swelling or leg pain.  Dr Sherryll Burger did overnight oximetry to se if she still needed O2 and called her to say she Did still need O2 for sleep.   04/05/11- 77 yoFnever smoker, followed for asthmatic bronchitis, hx sinusitis and DVT, complicated by GERD Needs flu vax has had more than one Pneumovax.  Breathing seems good to her, with dyspnea on hills and stairs.. Still uses O2 (1.5 L per minute/ [facility])at night per Dr Clelia Croft. She takes prednisone only as needed now.  04/30/11- 77 yoFnever smoker, followed for asthmatic bronchitis, hx sinusitis and DVT, complicated by GERD She was feeling much better last visit. Now comfortable sitting but noticing easier dyspnea on exertion especially with hills or stairs. She does not feel up to cleaning her house. Gradual onset without chest pain, palpitation or swelling. Dry hacking  cough. Has oxygen for sleep but not portable. Uses her home nebulizer machine with albuterol and Pulmicort. Denies history of anemia. Has had PFTs in the [place].  Review of Systems. Constitutional:   No weight loss, night sweats,  Fevers, chills, fatigue, lassitude. HEENT:   No headaches,  Difficulty swallowing,  Tooth/dental problems,  Sore throat,                No sneezing, itching, ear ache, nasal congestion, post nasal drip,  CV:  No chest pain, orthopnea, PND, swelling in lower extremities, anasarca, dizziness, palpitations GI  No heartburn, indigestion, abdominal pain, nausea, vomiting, diarrhea, change in bowel habits, loss of appetite Resp: No coughing up of blood.  No change in color of mucus.  No wheezing.  Skin: no rash or lesions. GU: no dysuria, change in color of urine, no urgency or frequency.  No flank pain. MS:  No joint pain or swelling.  No decreased range of motion.  No back pain. Psych:  No change in mood or affect. No depression or anxiety.  No memory loss.  Objective:   Physical Exam General- Alert, Oriented, Affect-appropriate, Distress- none acute. Quite talkative. Skin- rash-none, lesions- none, excoriation- none Lymphadenopathy- none Head- atraumatic            Eyes- Gross vision intact, PERRLA, conjunctivae clear secretions            Ears- Hearing, canals-normal            Nose- Clear, no-Septal dev, mucus, polyps, erosion, perforation  Throat- Mallampati II , mucosa clear , drainage- none, tonsils- atrophic Neck- flexible , trachea midline, no stridor , thyroid nl, carotid no bruit Chest - symmetrical excursion , unlabored           Heart/CV- RRR , 1-2/6 syst murmur LUSB , no gallop  , no rub, nl s1 s2                           - JVD- none , +edema- trace, stasis changes- none, varices- none           Lung- decreased breath sounds with no crackles, wheeze- none, cough- none , dullness-none, rub- none, unlabored.           Chest wall-  Abd-  tender-no, distended-no, bowel sounds-present, HSM- no Br/ Gen/ Rectal- Not done, not indicated Extrem- cyanosis- none, clubbing, none, atrophy- none, strength- nl Neuro- grossly intact to observation

## 2011-04-30 NOTE — Patient Instructions (Addendum)
Order- CXR  Dx dyspnea              Lab- CBC w diff, D-dimer, BNP     Order- [facility]   - home O2 assessment on room air- sleep and exercise       Dx Dyspnea, bonchitis   Sample Spiriva- one daily    Depo 80

## 2011-05-10 NOTE — Progress Notes (Signed)
Quick Note:  LMTCB ______ 

## 2011-05-12 ENCOUNTER — Telehealth: Payer: Self-pay | Admitting: Internal Medicine

## 2011-05-12 NOTE — Telephone Encounter (Signed)
Pt aware of cxr results per CDY and verbalized understanding.   Notes Recorded by Waymon Budge, MD on 04/30/2011 at 5:22 PM CXR-0 COPD changes only. Nothing new

## 2011-05-21 NOTE — Progress Notes (Signed)
Quick Note:  Ebony Farmer, [facility] 05/12/2011 2:32 PM Signed Pt aware of cxr results per CDY and verbalized understanding.    ______

## 2011-06-11 ENCOUNTER — Encounter: Payer: Self-pay | Admitting: Internal Medicine

## 2011-06-11 ENCOUNTER — Ambulatory Visit (INDEPENDENT_AMBULATORY_CARE_PROVIDER_SITE_OTHER): Payer: Medicare Other | Admitting: Internal Medicine

## 2011-06-11 VITALS — BP 140/72 | HR 85 | Ht 67.0 in | Wt 179.6 lb

## 2011-06-11 DIAGNOSIS — J329 Chronic sinusitis, unspecified: Secondary | ICD-10-CM

## 2011-06-11 DIAGNOSIS — J42 Unspecified chronic bronchitis: Secondary | ICD-10-CM

## 2011-06-11 MED ORDER — PHENYLEPHRINE HCL 1 % NA SOLN
3.0000 [drp] | Freq: Once | NASAL | Status: AC
  Administered 2011-06-11: 3 [drp] via NASAL

## 2011-06-11 MED ORDER — CLARITHROMYCIN 500 MG PO TABS: ORAL_TABLET | ORAL | Status: AC

## 2011-06-11 NOTE — Patient Instructions (Signed)
Script sent Biaxin- expect it to affect your warfarin  Neb neo nasal  Order- Home rest and exercise O2 sat on room air    For dx   Chronic bronchitis

## 2011-06-11 NOTE — Progress Notes (Signed)
Patient ID: Ebony Farmer, female    DOB: Nov 23, 1933, 77 y.o.   MRN: [id number]  HPI 11/26/10- 77 yoFnever smoker, followed for asthmatic bronchitis, hx sinusitis and DVT, complicated by GERD Last here August 28, 2010.- Note reviewed She had taken a long antibiotic course for sinusitis.  Since last here she says she has done pretty well, with no injections or antibiotics. She paces herself more carefully now. Had to close house against early Spring pollen. Fluticasone does help.  DOE varies- somedays can't walk to daughter's house. [facility] Uses nebulizer 4 x daily, plus 2 pulmicort treatments.  Productive cough varies- usually white. May get green from sinuses.   03/02/11- 77 yoFnever smoker, followed for asthmatic bronchitis, hx sinusitis and DVT, complicated by GERD 3-4 days malaise she indicates she may have caught from new infant in family. She got short of breath at store, came home and napped with her oxygen . Today feels better, just no energy. Sinus pressure, sore throat. Feels tighter in chest, some wheeze and dry cough. Denies chest pain, swelling or leg pain.  Dr Sherryll Burger did overnight oximetry to se if she still needed O2 and called her to say she Did still need O2 for sleep.   04/05/11- 77 yoFnever smoker, followed for asthmatic bronchitis, hx sinusitis and DVT, complicated by GERD Needs flu vax has had more than one Pneumovax.  Breathing seems good to her, with dyspnea on hills and stairs.. Still uses O2 (1.5 L per minute/ [facility])at night per Dr Clelia Croft. She takes prednisone only as needed now.  04/30/11- 77 yoFnever smoker, followed for asthmatic bronchitis, hx sinusitis and DVT, complicated by GERD She was feeling much better last visit. Now comfortable sitting but noticing easier dyspnea on exertion especially with hills or stairs. She does not feel up to cleaning her house. Gradual onset without chest pain, palpitation or swelling. Dry hacking  cough. Has oxygen for sleep but not portable. Uses her home nebulizer machine with albuterol and Pulmicort. Denies history of anemia. Has had PFTs in the [place].  06/11/11-  77 yoFnever smoker, followed for asthmatic bronchitis, hx sinusitis and DVT, complicated by GERD Has had flu vaccine. She reports that her son has a history of chronic DVT/PE. Still easy dyspnea on exertion. She did not get overnight oximetry after last visit. Has home oxygen concentrator but wants evaluation for portable oxygen. Complains of sinus infection, frontal headache, postnasal drip. Was treated with a Z-Pak, ending today. Denies fever, chest pain, palpitation or swelling.  Review of Systems. Constitutional:   No weight loss, night sweats,  Fevers, chills, fatigue, lassitude. HEENT:   + headaches,  Difficulty swallowing,  Tooth/dental problems,  Sore throat,                No sneezing, itching, ear ache, +nasal congestion, post nasal drip,  CV:  No chest pain, orthopnea, PND, swelling in lower extremities, anasarca, dizziness, palpitations GI  No heartburn, indigestion, abdominal pain, nausea, vomiting, diarrhea, change in bowel habits, loss of appetite Resp: No coughing up of blood.  No change in color of mucus.  No wheezing.  Skin: no rash or lesions. GU: no dysuria, change in color of urine, no urgency or frequency.  No flank pain. MS:  No joint pain or swelling.  No decreased range of motion.  No back pain. Psych:  No change in mood or affect. No depression or anxiety.  No memory loss.  Objective:  Physical Exam General- Alert, Oriented, Affect-appropriate, Distress- none acute. Quite talkative. Skin- rash-none, lesions- none, excoriation- none Lymphadenopathy- none Head- atraumatic            Eyes- Gross vision intact, PERRLA, conjunctivae clear secretions            Ears- Hearing, canals-normal            Nose- Clear, no-Septal dev, mucus, polyps, erosion, perforation             Throat- Mallampati II ,  mucosa clear , drainage- none, tonsils- atrophic Neck- flexible , trachea midline, no stridor , thyroid nl, carotid no bruit Chest - symmetrical excursion , unlabored           Heart/CV- RRR , 1-2/6 syst murmur LUSB , no gallop  , no rub, nl s1 s2                           - JVD- none , +edema- trace, stasis changes- none, varices- none           Lung- decreased breath sounds with no crackles, wheeze- none, cough- none , dullness-none, rub- none, unlabored.           Chest wall-  Abd- tender-no, distended-no, bowel sounds-present, HSM- no Br/ Gen/ Rectal- Not done, not indicated Extrem- cyanosis- none, clubbing, none, atrophy- none, strength- nl Neuro- grossly intact to observation

## 2011-06-13 NOTE — Assessment & Plan Note (Signed)
Based on symptoms, we will treat today as sinusitis.

## 2011-06-13 NOTE — Assessment & Plan Note (Signed)
We will reassess her oxygen status. Discussed the several potential causes for exertional dyspnea.

## 2011-10-04 ENCOUNTER — Ambulatory Visit: Payer: Medicare Other | Admitting: Internal Medicine

## 2011-10-15 ENCOUNTER — Ambulatory Visit: Payer: Medicare Other | Admitting: Internal Medicine

## 2012-05-12 ENCOUNTER — Telehealth: Payer: Self-pay | Admitting: Internal Medicine

## 2012-05-12 ENCOUNTER — Ambulatory Visit (INDEPENDENT_AMBULATORY_CARE_PROVIDER_SITE_OTHER): Payer: Medicare Other | Admitting: Internal Medicine

## 2012-05-12 ENCOUNTER — Encounter: Payer: Self-pay | Admitting: Internal Medicine

## 2012-05-12 VITALS — BP 124/70 | HR 79 | Temp 97.0°F | Ht 67.0 in | Wt 163.0 lb

## 2012-05-12 DIAGNOSIS — J209 Acute bronchitis, unspecified: Secondary | ICD-10-CM

## 2012-05-12 MED ORDER — PREDNISONE (PAK) 10 MG PO TABS: ORAL_TABLET | ORAL | Status: DC

## 2012-05-12 MED ORDER — AZITHROMYCIN 250 MG PO TABS: ORAL_TABLET | ORAL | Status: DC

## 2012-05-12 NOTE — Progress Notes (Signed)
Patient ID: Ebony Farmer, female    DOB: 1933-11-11  MRN: [id number]  HPI 11/26/10- 78 yoFnever smoker, followed for asthmatic bronchitis, hx sinusitis and DVT, complicated by GERD Last here August 28, 2010.- Note reviewed She had taken a long antibiotic course for sinusitis.  Since last here she says she has done pretty well, with no injections or antibiotics. She paces herself more carefully now. Had to close house against early Spring pollen. Fluticasone does help.  DOE varies- somedays can't walk to daughter's house. [facility] Uses nebulizer 4 x daily, plus 2 pulmicort treatments.  Productive cough varies- usually white. May get green from sinuses.   03/02/11- 76 yoFnever smoker, followed for asthmatic bronchitis, hx sinusitis and DVT, complicated by GERD 3-4 days malaise she indicates she may have caught from new infant in family. She got short of breath at store, came home and napped with her oxygen . Today feels better, just no energy. Sinus pressure, sore throat. Feels tighter in chest, some wheeze and dry cough. Denies chest pain, swelling or leg pain.  Ebony Farmer did overnight oximetry to se if she still needed O2 and called her to say she Did still need O2 for sleep.   04/05/11- 76 yoFnever smoker, followed for asthmatic bronchitis, hx sinusitis and DVT, complicated by GERD Needs flu vax has had more than one Pneumovax.  Breathing seems good to her, with dyspnea on hills and stairs.. Still uses O2 (1.5 L per minute/ Ebony Farmer)at night per Ebony Farmer. She takes prednisone only as needed now.  04/30/11- 76 yoFnever smoker, followed for asthmatic bronchitis, hx sinusitis and DVT, complicated by GERD She was feeling much better last visit. Now comfortable sitting but noticing easier dyspnea on exertion especially with hills or stairs. She does not feel up to cleaning her house. Gradual onset without chest pain, palpitation or swelling. Dry hacking cough. Has  oxygen for sleep but not portable. Uses her home nebulizer machine with albuterol and Pulmicort. Denies history of anemia. Has had PFTs in the [place].  06/11/11-  76 yoFnever smoker, followed for asthmatic bronchitis, hx sinusitis and DVT, complicated by GERD Has had flu vaccine. She reports that her son has a history of chronic DVT/PE. Still easy dyspnea on exertion. She did not get overnight oximetry after last visit. Has home oxygen concentrator but wants evaluation for portable oxygen. Complains of sinus infection, frontal headache, postnasal drip. Was treated with a Z-Pak, ending today. rec Script sent Biaxin- expect it to affect your warfarin Neb neo nasal Order- Home rest and exercise O2 sat on room air    For dx   Chronic bronchitis  05/12/2012 acute w/in ov/Ebony Farmer cc Pt c/o increased SOB for the past 3-4 days. Also has some prod cough with moderate green sputum-esp in the am.  Baseline is not clear, Patient failed to answer a single question asked in a straightforward manner, tending to go off on tangents or answer questions with ambiguous medical terms or diagnoses ("I have copd", not documented in our records) and seemed aggravated to point of being hostile  when asked the same question more than once for clarification.  Apparently using albuterol as a maint and doesn't articulate any plan on how to titrate it prn.  Sleeping ok without nocturnal  or early am exacerbation  of respiratory  c/o's or need for noct saba. Also denies any obvious fluctuation of symptoms with weather or environmental changes or other aggravating or alleviating  factors except as outlined above   ROS  The following are not active complaints unless bolded sore throat, dysphagia, dental problems, itching, sneezing,  nasal congestion or excess/ purulent secretions, ear ache,   fever, chills, sweats, unintended wt loss, pleuritic or exertional cp, hemoptysis,  orthopnea pnd or leg swelling, presyncope, palpitations, heartburn,  abdominal pain, anorexia, nausea, vomiting, diarrhea  or change in bowel or urinary habits, change in stools or urine, dysuria,hematuria,  rash, arthralgias, visual complaints, headache, numbness weakness or ataxia or problems with walking or coordination,  change in mood/affect or memory.          Objective:   Physical Exam General- Alert, Oriented,   Quite talkative but rambles and doesn't answer direct questions Skin- rash-none, lesions- none, excoriation- none Lymphadenopathy- none Head- atraumatic            Eyes- Gross vision intact, PERRLA, conjunctivae clear secretions            Ears- Hearing, canals-normal            Nose- Clear, no-Septal dev, mucus, polyps, erosion, perforation             Throat- Mallampati II , mucosa clear , drainage- none, tonsils- atrophic Neck- flexible , trachea midline, no stridor , thyroid nl, carotid no bruit Chest - symmetrical excursion , unlabored           Heart/CV- RRR , 1-2/6 syst murmur LUSB , no gallop  , no rub, nl s1 s2                           - JVD- none , +edema- trace, stasis changes- none, varices- none           Lung- decreased breath sounds with mid exp wheeze bilateral p neb alb w/in past 4 hours , dullness-none, rub- none, unlabored.           Chest wall-  Abd- tender-no, distended-no, bowel sounds-present, HSM- no Br/ Gen/ Rectal- Not done, not indicated Extrem- cyanosis- none, clubbing, none, atrophy- none, strength- nl Neuro- grossly intact to observation

## 2012-05-12 NOTE — Patient Instructions (Addendum)
Ok to use albuterol 2.5 mg every 4 hours if needed for your breathing but the ultimate goal is not to need  it more than a few times a week  Prednisone 10 mg take  4 each am x 2 days,   2 each am x 2 days,  1 each am x2days and stop   zpak  Called in  You will need to regroup with Dr Maple Hudson in the next few weeks to discuss your longterm maintenance asthma routine.- I will let him know about my concerns with your medications but don't want you to change them for now.

## 2012-05-12 NOTE — Telephone Encounter (Signed)
Spoke with pt. She is c/o chest tightness and increased SOB.  OV with MW for 4:30 today for eval

## 2012-05-13 NOTE — Assessment & Plan Note (Addendum)
Apparently poor chronic control of symptoms now acutely worse x 4 days and no insight on how to titrate up.    DDX of  difficult airways managment all start with A and  include Adherence, Ace Inhibitors, Acid Reflux, Active Sinus Disease, Alpha 1 Antitripsin deficiency, Anxiety masquerading as Airways dz,  ABPA,  allergy(esp in young), Aspiration (esp in elderly), Adverse effects of DPI,  Active smokers, plus two Bs  = Bronchiectasis and Beta blocker use..and one C= CHF  I had an extended discussion with the patient today lasting 15 to 20 minutes of a 25 minute visit on the following issues:  Adherence is always the initial "prime suspect" and is a multilayered concern that requires a "trust but verify" approach in every patient - starting with knowing how to use medications, especially inhalers, correctly, keeping up with refills and understanding the fundamental difference between maintenance and prns vs those medications only taken for a very short course and then stopped and not refilled.   I hit a wall here and had an extended conversation with this pt, witnessed by Dr Craige Cotta, that went no where in terms of helping this lady understand her meds.  There is no evidence she's been refilling her pulmicort through [place]  There is no evience of sign copd by pfts 01/09/09 and she has never smoked, so this is clearly chronic asthma with acute exacerbation  ? Acid reflux component > on rx ? Acute sinusitis > if so needs more than Zpak but she was reluctant to consider any rx not approved by Dr Maple Hudson  REc rx with zpak and pred pak as in past and have Dr Maple Hudson call her on 11/11 to regroup

## 2012-05-22 ENCOUNTER — Encounter: Payer: Self-pay | Admitting: Internal Medicine

## 2012-05-22 ENCOUNTER — Ambulatory Visit (INDEPENDENT_AMBULATORY_CARE_PROVIDER_SITE_OTHER): Payer: Medicare Other | Admitting: Internal Medicine

## 2012-05-22 VITALS — BP 118/80 | HR 80 | Ht 66.0 in | Wt 162.2 lb

## 2012-05-22 DIAGNOSIS — Z23 Encounter for immunization: Secondary | ICD-10-CM

## 2012-05-22 DIAGNOSIS — J309 Allergic rhinitis, unspecified: Secondary | ICD-10-CM

## 2012-05-22 DIAGNOSIS — J209 Acute bronchitis, unspecified: Secondary | ICD-10-CM

## 2012-05-22 MED ORDER — LEVALBUTEROL HCL 0.63 MG/3ML IN NEBU
0.6300 mg | INHALATION_SOLUTION | Freq: Once | RESPIRATORY_TRACT | Status: AC
  Administered 2012-05-22: 0.63 mg via RESPIRATORY_TRACT

## 2012-05-22 MED ORDER — CLARITHROMYCIN 500 MG PO TABS: ORAL_TABLET | ORAL | Status: AC

## 2012-05-22 MED ORDER — PROMETHAZINE-CODEINE 6.25-10 MG/5ML PO SYRP: 5.0000 mL | ORAL_SOLUTION | ORAL | Status: AC | PRN

## 2012-05-22 NOTE — Patient Instructions (Addendum)
Script for biaxin antibiotic.  This will interact temporarily with your coumadin and lipitor. Watch for increased bleeding.  Sample Dymista nasal spray     1-2 puffs each nostril once every day at bedtime.    Use this up, trying it instead of fluticasone/ Flonase.  Use your prilosec twice daily - before breakfast and supper- while you finish the left over prednisone.  Script for cough medicine  Neb Xop 0.63  Please call as needed

## 2012-05-22 NOTE — Progress Notes (Signed)
Patient ID: Ebony Farmer, female    DOB: December 15, 1933, 78 y.o.   MRN: [id number]  HPI 11/26/10- 76 yoFnever smoker, followed for asthmatic bronchitis, hx sinusitis and DVT, complicated by GERD Last here August 28, 2010.- Note reviewed She had taken a long antibiotic course for sinusitis.  Since last here she says she has done pretty well, with no injections or antibiotics. She paces herself more carefully now. Had to close house against early Spring pollen. Fluticasone does help.  DOE varies- somedays can't walk to daughter's house. [facility] Uses nebulizer 4 x daily, plus 2 pulmicort treatments.  Productive cough varies- usually white. May get green from sinuses.   03/02/11- 76 yoFnever smoker, followed for asthmatic bronchitis, hx sinusitis and DVT, complicated by GERD 3-4 days malaise she indicates she may have caught from new infant in family. She got short of breath at store, came home and napped with her oxygen . Today feels better, just no energy. Sinus pressure, sore throat. Feels tighter in chest, some wheeze and dry cough. Denies chest pain, swelling or leg pain.  Dr Sherryll BurgerShah did overnight oximetry to se if she still needed O2 and called her to say she Did still need O2 for sleep.   04/05/11- 76 yoFnever smoker, followed for asthmatic bronchitis, hx sinusitis and DVT, complicated by GERD Needs flu vax has had more than one Pneumovax.  Breathing seems good to her, with dyspnea on hills and stairs.. Still uses O2 (1.5 L per minute/ [facility])at night per Dr Clelia CroftShaw. She takes prednisone only as needed now.  04/30/11- 76 yoFnever smoker, followed for asthmatic bronchitis, hx sinusitis and DVT, complicated by GERD She was feeling much better last visit. Now comfortable sitting but noticing easier dyspnea on exertion especially with hills or stairs. She does not feel up to cleaning her house. Gradual onset without chest pain, palpitation or swelling. Dry hacking  cough. Has oxygen for sleep but not portable. Uses her home nebulizer machine with albuterol and Pulmicort. Denies history of anemia. Has had PFTs in the [facility].  06/11/11-  76 yoFnever smoker, followed for asthmatic bronchitis, hx sinusitis and DVT, complicated by GERD Has had flu vaccine. She reports that her son has a history of chronic DVT/PE. Still easy dyspnea on exertion. She did not get overnight oximetry after last visit. Has home oxygen concentrator but wants evaluation for portable oxygen. Complains of sinus infection, frontal headache, postnasal drip. Was treated with a Z-Pak, ending today. Denies fever, chest pain, palpitation or swelling.  05/12/2012 acute w/in ov/Wert cc Pt c/o increased SOB for the past 3-4 days. Also has some prod cough with moderate green sputum-esp in the am. Baseline is not clear, Patient failed to answer a single question asked in a straightforward manner, tending to go off on tangents or answer questions with ambiguous medical terms or diagnoses ("I have copd", not documented in our records) and seemed aggravated to point of being hostile when asked the same question more than once for clarification. Apparently using albuterol as a maint and doesn't articulate any plan on how to titrate it prn.  Sleeping ok without nocturnal or early am exacerbation of respiratory c/o's or need for noct saba. Also denies any obvious fluctuation of symptoms with weather or environmental changes    05/22/12-78 yoFnever smoker, followed for asthmatic bronchitis, hx sinusitis and DVT, complicated by GERD Pt reports breathing,cough and SOB is "about the same" still feels like there is some infection--denies  any other concerns  Here with her granddaughter. Chest congestion with cough productive of  dark green sputum x several weeks. Frontal sinus pressure without headache. Denies fever or sore throat. Uses nebulizer almost every day as instructed. Was given prednisone taper at last visit  November 8 but only took 3 days because it bothered her stomach. CXR 05/21/11 images reviewed IMPRESSION:  Chronic emphysematous and bronchitic type lung changes but no acute  pulmonary findings.  Original Report Authenticated By: P. Loralie Champagne, M.D.   Review of Systems- see HPI Constitutional:   No weight loss, night sweats,  Fevers, chills, fatigue, lassitude. HEENT:   + headaches,  Difficulty swallowing,  Tooth/dental problems,  Sore throat,                No sneezing, itching, ear ache, +nasal congestion, post nasal drip,  CV:  No chest pain, orthopnea, PND, swelling in lower extremities, anasarca, dizziness, palpitations GI  No heartburn, indigestion, abdominal pain, nausea, vomiting,  Resp: No coughing up of blood.  No change in color of mucus.  No wheezing.  Skin: no rash or lesions. GU:  MS:  No joint pain or swelling.  No decreased range of motion.  No back pain. Psych:  No change in mood or affect. No depression or anxiety.  No memory loss.  Objective:   Physical Exam General- Alert, Oriented, Affect-appropriate, Distress- none acute. Quite talkative. Skin- rash-none, lesions- none, excoriation- none Lymphadenopathy- none Head- atraumatic            Eyes- Gross vision intact, PERRLA, conjunctivae clear secretions            Ears- Hearing, canals-normal            Nose- Clear, no-Septal dev, mucus, polyps, erosion, perforation             Throat- Mallampati II , mucosa clear , drainage- none, tonsils- atrophic Neck- flexible , trachea midline, no stridor , thyroid nl, carotid no bruit Chest - symmetrical excursion , unlabored           Heart/CV- RRR , 1-2/6 syst murmur LUSB , no gallop  , no rub, nl s1 s2                           - JVD- none , +edema- trace, stasis changes- none, varices- none           Lung- decreased breath sounds with no crackles, wheeze- none, cough+ dry, with deep breath , dullness-none, rub- none, unlabored.           Chest wall-  Abd- Br/ Gen/  Rectal- Not done, not indicated Extrem- cyanosis- none, clubbing, none, atrophy- none, strength- nl Neuro- grossly intact to observation

## 2012-05-29 ENCOUNTER — Encounter: Payer: Self-pay | Admitting: Internal Medicine

## 2012-05-29 NOTE — Assessment & Plan Note (Signed)
Plan-sample Dymista nasal spray with instructions

## 2012-05-29 NOTE — Assessment & Plan Note (Signed)
Subacute exacerbation with productive cough over the last several weeks, no fever/blood/chest pain or nodes. Plan-antibiotic, Mucinex and fluids. Flu vaccine.

## 2012-07-24 ENCOUNTER — Ambulatory Visit (INDEPENDENT_AMBULATORY_CARE_PROVIDER_SITE_OTHER): Payer: Medicare Other | Admitting: Internal Medicine

## 2012-07-24 ENCOUNTER — Encounter: Payer: Self-pay | Admitting: Internal Medicine

## 2012-07-24 ENCOUNTER — Ambulatory Visit (INDEPENDENT_AMBULATORY_CARE_PROVIDER_SITE_OTHER)
Admission: RE | Admit: 2012-07-24 | Discharge: 2012-07-24 | Disposition: A | Discharge status: Home / Self Care | Payer: Medicare Other | Source: Ambulatory Visit | Attending: Internal Medicine | Admitting: Internal Medicine

## 2012-07-24 VITALS — BP 130/80 | HR 78 | Ht 66.0 in | Wt 159.2 lb

## 2012-07-24 DIAGNOSIS — J449 Chronic obstructive pulmonary disease, unspecified: Secondary | ICD-10-CM

## 2012-07-24 DIAGNOSIS — J309 Allergic rhinitis, unspecified: Secondary | ICD-10-CM

## 2012-07-24 MED ORDER — AZELASTINE-FLUTICASONE 137-50 MCG/ACT NA SUSP: 1.0000 | Freq: Every day | NASAL | Status: DC

## 2012-07-24 NOTE — Progress Notes (Signed)
Patient ID: Ebony Farmer, female    DOB: December 15, 1933, 79 y.o.   MRN: [id number]  HPI 11/26/10- 79 yoFnever smoker, followed for asthmatic bronchitis, hx sinusitis and DVT, complicated by GERD Last here August 28, 2010.- Note reviewed She had taken a long antibiotic course for sinusitis.  Since last here she says she has done pretty well, with no injections or antibiotics. She paces herself more carefully now. Had to close house against early Spring pollen. Fluticasone does help.  DOE varies- somedays can't walk to daughter's house. [facility] Uses nebulizer 4 x daily, plus 2 pulmicort treatments.  Productive cough varies- usually white. May get green from sinuses.   03/02/11- 79 yoFnever smoker, followed for asthmatic bronchitis, hx sinusitis and DVT, complicated by GERD 3-4 days malaise she indicates she may have caught from new infant in family. She got short of breath at store, came home and napped with her oxygen . Today feels better, just no energy. Sinus pressure, sore throat. Feels tighter in chest, some wheeze and dry cough. Denies chest pain, swelling or leg pain.  Dr Sherryll BurgerShah did overnight oximetry to se if she still needed O2 and called her to say she Did still need O2 for sleep.   04/05/11- 79 yoFnever smoker, followed for asthmatic bronchitis, hx sinusitis and DVT, complicated by GERD Needs flu vax has had more than one Pneumovax.  Breathing seems good to her, with dyspnea on hills and stairs.. Still uses O2 (1.5 L per minute/ [facility])at night per Dr Clelia CroftShaw. She takes prednisone only as needed now.  04/30/11- 79 yoFnever smoker, followed for asthmatic bronchitis, hx sinusitis and DVT, complicated by GERD She was feeling much better last visit. Now comfortable sitting but noticing easier dyspnea on exertion especially with hills or stairs. She does not feel up to cleaning her house. Gradual onset without chest pain, palpitation or swelling. Dry hacking  cough. Has oxygen for sleep but not portable. Uses her home nebulizer machine with albuterol and Pulmicort. Denies history of anemia. Has had PFTs in the [facility].  06/11/11-  79 yoFnever smoker, followed for asthmatic bronchitis, hx sinusitis and DVT, complicated by GERD Has had flu vaccine. She reports that her son has a history of chronic DVT/PE. Still easy dyspnea on exertion. She did not get overnight oximetry after last visit. Has home oxygen concentrator but wants evaluation for portable oxygen. Complains of sinus infection, frontal headache, postnasal drip. Was treated with a Z-Pak, ending today. Denies fever, chest pain, palpitation or swelling.  05/12/2012 acute w/in ov/Wert cc Pt c/o increased SOB for the past 3-4 days. Also has some prod cough with moderate green sputum-esp in the am. Baseline is not clear, Patient failed to answer a single question asked in a straightforward manner, tending to go off on tangents or answer questions with ambiguous medical terms or diagnoses ("I have copd", not documented in our records) and seemed aggravated to point of being hostile when asked the same question more than once for clarification. Apparently using albuterol as a maint and doesn't articulate any plan on how to titrate it prn.  Sleeping ok without nocturnal or early am exacerbation of respiratory c/o's or need for noct saba. Also denies any obvious fluctuation of symptoms with weather or environmental changes    05/22/12-79 yoFnever smoker, followed for asthmatic bronchitis, hx sinusitis and DVT, complicated by GERD Pt reports breathing,cough and SOB is "about the same" still feels like there is some infection--denies  any other concerns  Here with her granddaughter. Chest congestion with cough productive of  dark green sputum x several weeks. Frontal sinus pressure without headache. Denies fever or sore throat. Uses nebulizer almost every day as instructed. Was given prednisone taper at last visit  November 8 but only took 3 days because it bothered her stomach. CXR 05/21/11 images reviewed IMPRESSION:  Chronic emphysematous and bronchitic type lung changes but no acute  pulmonary findings.  Original Report Authenticated By: P. Loralie Champagne, M.D.   07/24/12- 79 yoF never smoker, followed for asthmatic bronchitis, hx sinusitis and DVT, complicated by GERD Follows For: pt states of having off an on coughing spells,sob,wheezing. feels about the same. denies any fever.  Slow to clear bronchitis after last visit. Dr Sherryll Burger gave antibiotic which helped. Now she says that for her she is doing quite well. She liked Dymista.  Review of Systems- see HPI Constitutional:   No weight loss, night sweats,  Fevers, chills, fatigue, lassitude. HEENT:   + headaches,  Difficulty swallowing,  Tooth/dental problems,  Sore throat,                No sneezing, itching, ear ache, +nasal congestion, post nasal drip,  CV:  No chest pain, orthopnea, PND, swelling in lower extremities, anasarca, dizziness, palpitations GI  No heartburn, indigestion, abdominal pain, nausea, vomiting,  Resp: No coughing up of blood.  No change in color of mucus.  No wheezing.  Skin: no rash or lesions. GU:  MS:  No joint pain or swelling.  No decreased range of motion.  No back pain. Psych:  No change in mood or affect. No depression or anxiety.  No memory loss.  Objective:   Physical Exam General- Alert, Oriented, Affect-appropriate, Distress- none acute. Quite talkative. Skin- rash-none, lesions- none, excoriation- none Lymphadenopathy- none Head- atraumatic            Eyes- Gross vision intact, PERRLA, conjunctivae clear secretions            Ears- Hearing, canals-normal            Nose- Clear, no-Septal dev, mucus, polyps, erosion, perforation             Throat- Mallampati II , mucosa clear , drainage- none, tonsils- atrophic Neck- flexible , trachea midline, no stridor , thyroid nl, carotid no bruit Chest - symmetrical  excursion , unlabored           Heart/CV- RRR , 1-2/6 syst murmur LUSB , no gallop  , no rub, nl s1 s2                           - JVD- none , +edema- trace, stasis changes- none, varices- none           Lung- decreased breath sounds with no crackles, wheeze+ mild L>R, cough+ dry, with deep breath , dullness-none, rub- none, unlabored.           Chest wall-  Abd- Br/ Gen/ Rectal- Not done, not indicated Extrem- cyanosis- none, clubbing, none, atrophy- none, strength- nl Neuro- grossly intact to observation

## 2012-07-24 NOTE — Patient Instructions (Addendum)
Script with coupon for Dymista nasal spray  Order- CXR  Dx COPD

## 2012-07-25 NOTE — Progress Notes (Signed)
Quick Note:  Pt aware of results. ______ 

## 2012-08-05 NOTE — Assessment & Plan Note (Signed)
Probably vasomotor plus allergic rhinitis. Plan-prescription for Dymista

## 2012-08-05 NOTE — Assessment & Plan Note (Signed)
Subjectively improved. Plan-chest x-ray

## 2012-10-23 ENCOUNTER — Telehealth: Payer: Self-pay | Admitting: Internal Medicine

## 2012-10-23 MED ORDER — AZITHROMYCIN 250 MG PO TABS: ORAL_TABLET | ORAL | Status: DC

## 2012-10-23 NOTE — Telephone Encounter (Signed)
Z pak Rx sent to [facility] in [place] -- pt aware.  Received override msg between coumadin and z pak.  Spoke with Dr. Maple Hudson.  He is ok with proceeding with zpak given pt's PCN allergy.  She should be careful and let [facility] know she is on zpak.  Pt aware of this and is in agreement with this plan.  She is to call back if symptoms do not improve or worsen.  She voiced no further questions or concerns at this time.

## 2012-10-23 NOTE — Telephone Encounter (Signed)
Per CY-offer Zpak #1 take as directed no refills.  

## 2012-10-23 NOTE — Telephone Encounter (Signed)
Last OV 07-24-12. COPD pt and she is c/o increased SOB, wheezing, chest tightness, nasal and sinus congestion, chest congestion, productive cough with yellow phlegm all x 1 week. Pt states she has been taking OTC antihistamine/cold medication without relief. She is taking all other meds as directed. Pt requests an appt of rx called in. Please advise. Carron Curie, CMA Allergies  Allergen Reactions  . Penicillins

## 2013-01-02 ENCOUNTER — Telehealth: Payer: Self-pay | Admitting: Internal Medicine

## 2013-01-02 MED ORDER — CLARITHROMYCIN 250 MG PO TABS: 250.0000 mg | ORAL_TABLET | Freq: Two times a day (BID) | ORAL | Status: DC

## 2013-01-02 NOTE — Telephone Encounter (Signed)
Spoke with pt and she denies fluid retention, cp or palpitations.  Pt reports that it has been a while since she has taken Prednisone but she is okay with taking it if she needs too.  Also cough is prod with green sputum.  Please advise.

## 2013-01-02 NOTE — Telephone Encounter (Signed)
Spoke with pt and advised of Dr Roxy Cedar recommendations and rx sent to pharmacy.

## 2013-01-02 NOTE — Telephone Encounter (Signed)
Per CY-no infection; not wanting prednisone; is she retaining fluid, chest pain, or palpitations?

## 2013-01-02 NOTE — Telephone Encounter (Signed)
Per CY-lets give patient Biaxin 250 mg #14 take 1 po BID no refills.

## 2013-01-02 NOTE — Telephone Encounter (Signed)
Pt c/o increased OSB on exertion for 2-3 days now.  Having to use O2 during the day as needed.  No energy, sinus drainage, hoarsenss, prod cough (green).  Denies fever..  Pt denies Prednisone at this time.  Pt has appt on 01-22-13 but not sure if she can wait until then.  Pt is on Budesonide and Albuterol Nebs. Please advise. Allergies  Allergen Reactions  . Penicillins

## 2013-01-22 ENCOUNTER — Ambulatory Visit (INDEPENDENT_AMBULATORY_CARE_PROVIDER_SITE_OTHER): Payer: Medicare Other | Admitting: Internal Medicine

## 2013-01-22 VITALS — BP 136/80 | HR 66 | Ht 67.25 in | Wt 159.0 lb

## 2013-01-22 DIAGNOSIS — J441 Chronic obstructive pulmonary disease with (acute) exacerbation: Secondary | ICD-10-CM

## 2013-01-22 DIAGNOSIS — J449 Chronic obstructive pulmonary disease, unspecified: Secondary | ICD-10-CM

## 2013-01-22 DIAGNOSIS — J209 Acute bronchitis, unspecified: Secondary | ICD-10-CM

## 2013-02-04 ENCOUNTER — Encounter: Payer: Self-pay | Admitting: Internal Medicine

## 2013-02-04 NOTE — Assessment & Plan Note (Signed)
Mild exacerbation.  

## 2013-02-04 NOTE — Progress Notes (Signed)
Patient ID: Ebony Farmer, female    DOB: December 15, 1933, 79 y.o.   MRN: [id number]  HPI 11/26/10- 76 yoFnever smoker, followed for asthmatic bronchitis, hx sinusitis and DVT, complicated by GERD Last here August 28, 2010.- Note reviewed She had taken a long antibiotic course for sinusitis.  Since last here she says she has done pretty well, with no injections or antibiotics. She paces herself more carefully now. Had to close house against early Spring pollen. Fluticasone does help.  DOE varies- somedays can't walk to daughter's house. [facility] Uses nebulizer 4 x daily, plus 2 pulmicort treatments.  Productive cough varies- usually white. May get green from sinuses.   03/02/11- 76 yoFnever smoker, followed for asthmatic bronchitis, hx sinusitis and DVT, complicated by GERD 3-4 days malaise she indicates she may have caught from new infant in family. She got short of breath at store, came home and napped with her oxygen . Today feels better, just no energy. Sinus pressure, sore throat. Feels tighter in chest, some wheeze and dry cough. Denies chest pain, swelling or leg pain.  Dr Sherryll BurgerShah did overnight oximetry to se if she still needed O2 and called her to say she Did still need O2 for sleep.   04/05/11- 76 yoFnever smoker, followed for asthmatic bronchitis, hx sinusitis and DVT, complicated by GERD Needs flu vax has had more than one Pneumovax.  Breathing seems good to her, with dyspnea on hills and stairs.. Still uses O2 (1.5 L per minute/ [facility])at night per Dr Clelia CroftShaw. She takes prednisone only as needed now.  04/30/11- 76 yoFnever smoker, followed for asthmatic bronchitis, hx sinusitis and DVT, complicated by GERD She was feeling much better last visit. Now comfortable sitting but noticing easier dyspnea on exertion especially with hills or stairs. She does not feel up to cleaning her house. Gradual onset without chest pain, palpitation or swelling. Dry hacking  cough. Has oxygen for sleep but not portable. Uses her home nebulizer machine with albuterol and Pulmicort. Denies history of anemia. Has had PFTs in the [facility].  06/11/11-  76 yoFnever smoker, followed for asthmatic bronchitis, hx sinusitis and DVT, complicated by GERD Has had flu vaccine. She reports that her son has a history of chronic DVT/PE. Still easy dyspnea on exertion. She did not get overnight oximetry after last visit. Has home oxygen concentrator but wants evaluation for portable oxygen. Complains of sinus infection, frontal headache, postnasal drip. Was treated with a Z-Pak, ending today. Denies fever, chest pain, palpitation or swelling.  05/12/2012 acute w/in ov/Wert cc Pt c/o increased SOB for the past 3-4 days. Also has some prod cough with moderate green sputum-esp in the am. Baseline is not clear, Patient failed to answer a single question asked in a straightforward manner, tending to go off on tangents or answer questions with ambiguous medical terms or diagnoses ("I have copd", not documented in our records) and seemed aggravated to point of being hostile when asked the same question more than once for clarification. Apparently using albuterol as a maint and doesn't articulate any plan on how to titrate it prn.  Sleeping ok without nocturnal or early am exacerbation of respiratory c/o's or need for noct saba. Also denies any obvious fluctuation of symptoms with weather or environmental changes    05/22/12-78 yoFnever smoker, followed for asthmatic bronchitis, hx sinusitis and DVT, complicated by GERD Pt reports breathing,cough and SOB is "about the same" still feels like there is some infection--denies  any other concerns  Here with her granddaughter. Chest congestion with cough productive of  dark green sputum x several weeks. Frontal sinus pressure without headache. Denies fever or sore throat. Uses nebulizer almost every day as instructed. Was given prednisone taper at last visit  November 8 but only took 3 days because it bothered her stomach. CXR 05/21/11 images reviewed IMPRESSION:  Chronic emphysematous and bronchitic type lung changes but no acute  pulmonary findings.  Original Report Authenticated By: P. Loralie Champagne, M.D.   07/24/12- 79 yoF never smoker, followed for asthmatic bronchitis, hx sinusitis and DVT, complicated by GERD Follows For: pt states of having off an on coughing spells,sob,wheezing. feels about the same. denies any fever.  Slow to clear bronchitis after last visit. Dr Sherryll Burger gave antibiotic which helped. Now she says that for her she is doing quite well. She liked Dymista.  01/22/13- 79 yoF never smoker, followed for asthmatic bronchitis, hx sinusitis and DVT, complicated by GERD Reports breathing is doing well. No acute concerns. Primary physician extender prednisone and antibiotics this summer. Now at baseline, noticing that she tires easily with dyspnea on exertion. She blames lack of regular exercise. Home oxygen 2 L for sleep. Expects Dr. Clelia Croft to get chest x-ray with upcoming physical.  Review of Systems- see HPI Constitutional:   No weight loss, night sweats,  Fevers, chills, fatigue, lassitude. HEENT:  No- headaches,  Difficulty swallowing,  Tooth/dental problems,  Sore throat,                No sneezing, itching, ear ache,no-nasal congestion, post nasal drip,  CV:  No chest pain, orthopnea, PND, swelling in lower extremities, anasarca, dizziness, palpitations GI  No heartburn, indigestion, abdominal pain, nausea, vomiting,  Resp: No coughing up of blood.  No change in color of mucus.  No wheezing.  Skin: no rash or lesions. GU:  MS:  No joint pain or swelling.  No decreased range of motion.  No back pain. Psych:  No change in mood or affect. No depression or anxiety.  No memory loss.  Objective:   Physical Exam General- Alert, Oriented, Affect-appropriate, Distress- none acute. Quite talkative. Skin- rash-none, lesions- none,  excoriation- none Lymphadenopathy- none Head- atraumatic            Eyes- Gross vision intact, PERRLA, conjunctivae clear secretions            Ears- Hearing, canals-normal            Nose- Clear, no-Septal dev, mucus, polyps, erosion, perforation             Throat- Mallampati II , mucosa clear , drainage- none, tonsils- atrophic Neck- flexible , trachea midline, no stridor , thyroid nl, carotid no bruit Chest - symmetrical excursion , unlabored           Heart/CV- RRR , 1-2/6 syst murmur LUSB , no gallop  , no rub, nl s1 s2                           - JVD- none , +edema- trace, stasis changes- none, varices- none           Lung- +few crackles, wheeze+ mild L>R, cough+ dry, with deep breath , dullness-none, rub- none, unlabored.           Chest wall-  Abd- Br/ Gen/ Rectal- Not done, not indicated Extrem- cyanosis- none, clubbing, none, atrophy- none, strength- nl Neuro- grossly intact to observation

## 2013-02-04 NOTE — Assessment & Plan Note (Signed)
Mild exacerbation, nonspecific. Medications seem appropriate. Plan-gradually increase exercise.

## 2013-02-04 NOTE — Patient Instructions (Addendum)
Plan-try gradually increasing activity as she gets her strength back.  Continue present meds. Please call as needed.

## 2013-03-07 ENCOUNTER — Encounter (INDEPENDENT_AMBULATORY_CARE_PROVIDER_SITE_OTHER): Payer: Self-pay | Admitting: *Deleted

## 2013-03-14 ENCOUNTER — Other Ambulatory Visit (INDEPENDENT_AMBULATORY_CARE_PROVIDER_SITE_OTHER): Payer: Self-pay | Admitting: *Deleted

## 2013-03-14 ENCOUNTER — Telehealth (INDEPENDENT_AMBULATORY_CARE_PROVIDER_SITE_OTHER): Payer: Self-pay | Admitting: *Deleted

## 2013-03-14 ENCOUNTER — Encounter (INDEPENDENT_AMBULATORY_CARE_PROVIDER_SITE_OTHER): Payer: Self-pay | Admitting: *Deleted

## 2013-03-14 DIAGNOSIS — Z1211 Encounter for screening for malignant neoplasm of colon: Secondary | ICD-10-CM

## 2013-03-14 NOTE — Telephone Encounter (Signed)
Patient needs movi prep -- allergy: PCN

## 2013-03-15 MED ORDER — PEG-KCL-NACL-NASULF-NA ASC-C 100 G PO SOLR: 1.0000 | Freq: Once | ORAL | Status: DC

## 2013-04-10 ENCOUNTER — Telehealth: Payer: Self-pay | Admitting: Internal Medicine

## 2013-04-10 MED ORDER — PREDNISONE 10 MG PO TABS: ORAL_TABLET | ORAL | Status: DC

## 2013-04-10 NOTE — Telephone Encounter (Signed)
Pt aware of recs and rx has been sent. Nothing further needed

## 2013-04-10 NOTE — Telephone Encounter (Signed)
Pt returned call.  Ebony Farmer ° °

## 2013-04-10 NOTE — Telephone Encounter (Addendum)
Last OV 01/22/13, next OV 04/16/13. Pt states that she is having increased chest congestion, chest tightness, sinus congestion, SOB, and productive cough with green phlegm. She states she has bad fall allergies and she feels this is what causes her sinus congestion and chest congestion. Pt states that she has oxygen that she uses at night, but that she has used it some during the day over the last few days to help with the SOB. Pt has an appt on Monday but did not feel like she could wait until then for treatment. Please advise. Carron Curie, CMA Allergies  Allergen Reactions  . Penicillins

## 2013-04-10 NOTE — Telephone Encounter (Signed)
Per CY-give patient Prednisone 10 mg #20 take 4 x 2 days, 3 x 2 days, 2 x 2 days, 1 x 2 days, then stop no refills; Also keep appointment!. Thanks.

## 2013-04-10 NOTE — Telephone Encounter (Signed)
lmomtcb x1 for pt 

## 2013-04-16 ENCOUNTER — Encounter: Payer: Self-pay | Admitting: Internal Medicine

## 2013-04-16 ENCOUNTER — Encounter (INDEPENDENT_AMBULATORY_CARE_PROVIDER_SITE_OTHER): Payer: Self-pay

## 2013-04-16 ENCOUNTER — Ambulatory Visit (INDEPENDENT_AMBULATORY_CARE_PROVIDER_SITE_OTHER): Payer: Medicare Other | Admitting: Internal Medicine

## 2013-04-16 VITALS — BP 122/80 | HR 65 | Ht 67.0 in | Wt 160.6 lb

## 2013-04-16 DIAGNOSIS — Z23 Encounter for immunization: Secondary | ICD-10-CM

## 2013-04-16 DIAGNOSIS — J309 Allergic rhinitis, unspecified: Secondary | ICD-10-CM

## 2013-04-16 DIAGNOSIS — J310 Chronic rhinitis: Secondary | ICD-10-CM

## 2013-04-16 DIAGNOSIS — J449 Chronic obstructive pulmonary disease, unspecified: Secondary | ICD-10-CM

## 2013-04-16 MED ORDER — METHYLPREDNISOLONE ACETATE 80 MG/ML IJ SUSP
80.0000 mg | Freq: Once | INTRAMUSCULAR | Status: AC
  Administered 2013-04-16: 80 mg via INTRAMUSCULAR

## 2013-04-16 MED ORDER — PHENYLEPHRINE HCL 1 % NA SOLN
3.0000 [drp] | Freq: Once | NASAL | Status: AC
  Administered 2013-04-16: 3 [drp] via NASAL

## 2013-04-16 NOTE — Patient Instructions (Signed)
Neb neo nasal  Depo 80  Flu vax 

## 2013-04-16 NOTE — Progress Notes (Signed)
Patient ID: Ebony Farmer, female    DOB: December 15, 1933, 79 y.o.   MRN: [id number]  HPI 11/26/10- 76 yoFnever smoker, followed for asthmatic bronchitis, hx sinusitis and DVT, complicated by GERD Last here August 28, 2010.- Note reviewed She had taken a long antibiotic course for sinusitis.  Since last here she says she has done pretty well, with no injections or antibiotics. She paces herself more carefully now. Had to close house against early Spring pollen. Fluticasone does help.  DOE varies- somedays can't walk to daughter's house. [facility] Uses nebulizer 4 x daily, plus 2 pulmicort treatments.  Productive cough varies- usually white. May get green from sinuses.   03/02/11- 76 yoFnever smoker, followed for asthmatic bronchitis, hx sinusitis and DVT, complicated by GERD 3-4 days malaise she indicates she may have caught from new infant in family. She got short of breath at store, came home and napped with her oxygen . Today feels better, just no energy. Sinus pressure, sore throat. Feels tighter in chest, some wheeze and dry cough. Denies chest pain, swelling or leg pain.  Dr Sherryll BurgerShah did overnight oximetry to se if she still needed O2 and called her to say she Did still need O2 for sleep.   04/05/11- 76 yoFnever smoker, followed for asthmatic bronchitis, hx sinusitis and DVT, complicated by GERD Needs flu vax has had more than one Pneumovax.  Breathing seems good to her, with dyspnea on hills and stairs.. Still uses O2 (1.5 L per minute/ [facility])at night per Dr Clelia CroftShaw. She takes prednisone only as needed now.  04/30/11- 76 yoFnever smoker, followed for asthmatic bronchitis, hx sinusitis and DVT, complicated by GERD She was feeling much better last visit. Now comfortable sitting but noticing easier dyspnea on exertion especially with hills or stairs. She does not feel up to cleaning her house. Gradual onset without chest pain, palpitation or swelling. Dry hacking  cough. Has oxygen for sleep but not portable. Uses her home nebulizer machine with albuterol and Pulmicort. Denies history of anemia. Has had PFTs in the [facility].  06/11/11-  76 yoFnever smoker, followed for asthmatic bronchitis, hx sinusitis and DVT, complicated by GERD Has had flu vaccine. She reports that her son has a history of chronic DVT/PE. Still easy dyspnea on exertion. She did not get overnight oximetry after last visit. Has home oxygen concentrator but wants evaluation for portable oxygen. Complains of sinus infection, frontal headache, postnasal drip. Was treated with a Z-Pak, ending today. Denies fever, chest pain, palpitation or swelling.  05/12/2012 acute w/in ov/Wert cc Pt c/o increased SOB for the past 3-4 days. Also has some prod cough with moderate green sputum-esp in the am. Baseline is not clear, Patient failed to answer a single question asked in a straightforward manner, tending to go off on tangents or answer questions with ambiguous medical terms or diagnoses ("I have copd", not documented in our records) and seemed aggravated to point of being hostile when asked the same question more than once for clarification. Apparently using albuterol as a maint and doesn't articulate any plan on how to titrate it prn.  Sleeping ok without nocturnal or early am exacerbation of respiratory c/o's or need for noct saba. Also denies any obvious fluctuation of symptoms with weather or environmental changes    05/22/12-78 yoFnever smoker, followed for asthmatic bronchitis, hx sinusitis and DVT, complicated by GERD Pt reports breathing,cough and SOB is "about the same" still feels like there is some infection--denies  any other concerns  Here with her granddaughter. Chest congestion with cough productive of  dark green sputum x several weeks. Frontal sinus pressure without headache. Denies fever or sore throat. Uses nebulizer almost every day as instructed. Was given prednisone taper at last visit  November 8 but only took 3 days because it bothered her stomach. CXR 05/21/11 images reviewed IMPRESSION:  Chronic emphysematous and bronchitic type lung changes but no acute  pulmonary findings.  Original Report Authenticated By: P. Loralie Champagne, M.D.   07/24/12- 87 yoF never smoker, followed for asthmatic bronchitis, hx sinusitis and DVT, complicated by GERD Follows For: pt states of having off an on coughing spells,sob,wheezing. feels about the same. denies any fever.  Slow to clear bronchitis after last visit. Dr Sherryll Burger gave antibiotic which helped. Now she says that for her she is doing quite well. She liked Dymista.  01/22/13- 79 yoF never smoker, followed for asthmatic bronchitis, hx sinusitis and DVT, complicated by GERD Reports breathing is doing well. No acute concerns. Primary physician extender prednisone and antibiotics this summer. Now at baseline, noticing that she tires easily with dyspnea on exertion. She blames lack of regular exercise. Home oxygen 2 L for sleep. Expects Dr. Clelia Croft to get chest x-ray with upcoming physical.  04/16/13- 79 yoF never smoker, followed for asthmatic bronchitis, hx sinusitis and DVT, complicated by GERD FOLLOWS FOR:  Sinus pressure active and breathing has improved since last OV Prednisone called in 3 or 4 weeks ago did help bronchitis. Now complains of retro-orbital pressure and nasal congestion- no fever or purulent discharge. Chest feels clear. CXR 07/25/12 IMPRESSION:  COPD/chronic changes. No acute findings.  Original Report Authenticated By: Charlett Nose, M.D.  Review of Systems- see HPI Constitutional:   No weight loss, night sweats,  Fevers, chills, fatigue, lassitude. HEENT:  No- headaches,  Difficulty swallowing,  Tooth/dental problems,  Sore throat,                No sneezing, itching, ear ache,+nasal congestion, post nasal drip,  CV:  No chest pain, orthopnea, PND, swelling in lower extremities, anasarca, dizziness, palpitations GI  No  heartburn, indigestion, abdominal pain, nausea, vomiting,  Resp: No coughing up of blood.  No change in color of mucus.  No wheezing.  Skin: no rash or lesions. GU:  MS:  No joint pain or swelling.  No decreased range of motion.  No back pain. Psych:  No change in mood or affect. No depression or anxiety.  No memory loss.  Objective:   Physical Exam General- Alert, Oriented, Affect-appropriate, Distress- none acute. Quite talkative. Skin- rash-none, lesions- none, excoriation- none Lymphadenopathy- none Head- atraumatic            Eyes- Gross vision intact, PERRLA, conjunctivae clear secretions            Ears- Hearing, canals-normal            Nose- Clear, no-Septal dev, mucus, polyps, erosion, perforation             Throat- Mallampati II , mucosa clear , drainage- none, tonsils- atrophic Neck- flexible , trachea midline, no stridor , thyroid nl, carotid no bruit Chest - symmetrical excursion , unlabored           Heart/CV- RRR , 1-2/6 syst murmur LUSB , no gallop  , no rub, nl s1 s2                           -  JVD- none , +edema- trace, stasis changes- none, varices- none           Lung- +few crackles, wheeze+ mild L>R, cough+ dry, with deep breath , dullness-none, rub- none, unlabored.           Chest wall-  Abd- Br/ Gen/ Rectal- Not done, not indicated Extrem- cyanosis- none, clubbing, none, atrophy- none, strength- nl Neuro- grossly intact to observation

## 2013-04-29 NOTE — Assessment & Plan Note (Signed)
Discussed medications Plan-flu vaccine

## 2013-04-29 NOTE — Assessment & Plan Note (Signed)
Acute exacerbation. I don't think she has a sinus infection yet. Plan-nasal nebulizer decongestion, Depo-Medrol

## 2013-05-10 ENCOUNTER — Ambulatory Visit: Admit: 2013-05-10 | Payer: Self-pay | Admitting: Internal Medicine

## 2013-05-10 SURGERY — COLONOSCOPY
Anesthesia: Moderate Sedation

## 2013-05-22 ENCOUNTER — Telehealth: Payer: Self-pay | Admitting: Internal Medicine

## 2013-05-22 NOTE — Telephone Encounter (Signed)
lmomtcb x1--dont see where pt was called

## 2013-05-22 NOTE — Telephone Encounter (Signed)
Pt returned triage's call.  Pt states that there were 2 messages left for her.  Antionette Fairy

## 2013-05-22 NOTE — Telephone Encounter (Signed)
I spoke with pt advised her dont see where we called. Nothing further needed

## 2013-10-15 ENCOUNTER — Ambulatory Visit (INDEPENDENT_AMBULATORY_CARE_PROVIDER_SITE_OTHER): Payer: Medicare Other | Admitting: Internal Medicine

## 2013-10-15 ENCOUNTER — Encounter: Payer: Self-pay | Admitting: Internal Medicine

## 2013-10-15 VITALS — BP 124/66 | HR 71 | Ht 67.0 in | Wt 159.6 lb

## 2013-10-15 DIAGNOSIS — J309 Allergic rhinitis, unspecified: Secondary | ICD-10-CM

## 2013-10-15 DIAGNOSIS — J449 Chronic obstructive pulmonary disease, unspecified: Secondary | ICD-10-CM

## 2013-10-15 DIAGNOSIS — J4489 Other specified chronic obstructive pulmonary disease: Secondary | ICD-10-CM

## 2013-10-15 MED ORDER — METHYLPREDNISOLONE ACETATE 80 MG/ML IJ SUSP
80.0000 mg | Freq: Once | INTRAMUSCULAR | Status: AC
  Administered 2013-10-15: 80 mg via INTRAMUSCULAR

## 2013-10-15 MED ORDER — AZELASTINE-FLUTICASONE 137-50 MCG/ACT NA SUSP: NASAL | Status: AC

## 2013-10-15 MED ORDER — PHENYLEPHRINE HCL 1 % NA SOLN
3.0000 [drp] | Freq: Once | NASAL | Status: AC
  Administered 2013-10-15: 3 [drp] via NASAL

## 2013-10-15 NOTE — Progress Notes (Signed)
Patient ID: Ebony Farmer, female    DOB: December 15, 1933, 80 y.o.   MRN: [id number]  HPI 11/26/10- 76 yoFnever smoker, followed for asthmatic bronchitis, hx sinusitis and DVT, complicated by GERD Last here August 28, 2010.- Note reviewed She had taken a long antibiotic course for sinusitis.  Since last here she says she has done pretty well, with no injections or antibiotics. She paces herself more carefully now. Had to close house against early Spring pollen. Fluticasone does help.  DOE varies- somedays can't walk to daughter's house. [facility] Uses nebulizer 4 x daily, plus 2 pulmicort treatments.  Productive cough varies- usually white. May get green from sinuses.   03/02/11- 76 yoFnever smoker, followed for asthmatic bronchitis, hx sinusitis and DVT, complicated by GERD 3-4 days malaise she indicates she may have caught from new infant in family. She got short of breath at store, came home and napped with her oxygen . Today feels better, just no energy. Sinus pressure, sore throat. Feels tighter in chest, some wheeze and dry cough. Denies chest pain, swelling or leg pain.  Dr Sherryll BurgerShah did overnight oximetry to se if she still needed O2 and called her to say she Did still need O2 for sleep.   04/05/11- 76 yoFnever smoker, followed for asthmatic bronchitis, hx sinusitis and DVT, complicated by GERD Needs flu vax has had more than one Pneumovax.  Breathing seems good to her, with dyspnea on hills and stairs.. Still uses O2 (1.5 L per minute/ [facility])at night per Dr Clelia CroftShaw. She takes prednisone only as needed now.  04/30/11- 76 yoFnever smoker, followed for asthmatic bronchitis, hx sinusitis and DVT, complicated by GERD She was feeling much better last visit. Now comfortable sitting but noticing easier dyspnea on exertion especially with hills or stairs. She does not feel up to cleaning her house. Gradual onset without chest pain, palpitation or swelling. Dry hacking  cough. Has oxygen for sleep but not portable. Uses her home nebulizer machine with albuterol and Pulmicort. Denies history of anemia. Has had PFTs in the [facility].  06/11/11-  76 yoFnever smoker, followed for asthmatic bronchitis, hx sinusitis and DVT, complicated by GERD Has had flu vaccine. She reports that her son has a history of chronic DVT/PE. Still easy dyspnea on exertion. She did not get overnight oximetry after last visit. Has home oxygen concentrator but wants evaluation for portable oxygen. Complains of sinus infection, frontal headache, postnasal drip. Was treated with a Z-Pak, ending today. Denies fever, chest pain, palpitation or swelling.  05/12/2012 acute w/in ov/Wert cc Pt c/o increased SOB for the past 3-4 days. Also has some prod cough with moderate green sputum-esp in the am. Baseline is not clear, Patient failed to answer a single question asked in a straightforward manner, tending to go off on tangents or answer questions with ambiguous medical terms or diagnoses ("I have copd", not documented in our records) and seemed aggravated to point of being hostile when asked the same question more than once for clarification. Apparently using albuterol as a maint and doesn't articulate any plan on how to titrate it prn.  Sleeping ok without nocturnal or early am exacerbation of respiratory c/o's or need for noct saba. Also denies any obvious fluctuation of symptoms with weather or environmental changes    05/22/12-78 yoFnever smoker, followed for asthmatic bronchitis, hx sinusitis and DVT, complicated by GERD Pt reports breathing,cough and SOB is "about the same" still feels like there is some infection--denies  any other concerns  Here with her granddaughter. Chest congestion with cough productive of  dark green sputum x several weeks. Frontal sinus pressure without headache. Denies fever or sore throat. Uses nebulizer almost every day as instructed. Was given prednisone taper at last visit  November 8 but only took 3 days because it bothered her stomach. CXR 05/21/11 images reviewed IMPRESSION:  Chronic emphysematous and bronchitic type lung changes but no acute  pulmonary findings.  Original Report Authenticated By: P. Loralie ChampagneMARK GALLERANI, M.D.   07/24/12- [age] yoF never smoker, followed for asthmatic bronchitis, hx sinusitis and DVT, complicated by GERD Follows For: pt states of having off an on coughing spells,sob,wheezing. feels about the same. denies any fever.  Slow to clear bronchitis after last visit. Dr Sherryll BurgerShah gave antibiotic which helped. Now she says that for her she is doing quite well. She liked Dymista.  01/22/13- 79 yoF never smoker, followed for asthmatic bronchitis, hx sinusitis and DVT, complicated by GERD Reports breathing is doing well. No acute concerns. Primary physician extender prednisone and antibiotics this summer. Now at baseline, noticing that she tires easily with dyspnea on exertion. She blames lack of regular exercise. Home oxygen 2 L for sleep. Expects Dr. Clelia CroftShaw to get chest x-ray with upcoming physical.  04/16/13- 79 yoF never smoker, followed for asthmatic bronchitis, hx sinusitis and DVT, complicated by GERD FOLLOWS FOR:  Sinus pressure active and breathing has improved since last OV Prednisone called in 3 or 4 weeks ago did help bronchitis. Now complains of retro-orbital pressure and nasal congestion- no fever or purulent discharge. Chest feels clear. CXR 07/25/12 IMPRESSION:  COPD/chronic changes. No acute findings.  Original Report Authenticated By: Charlett NoseKevin Dover, M.D.  10/15/13- [age] yoF never smoker, followed for asthmatic bronchitis, hx sinusitis and DVT, complicated by GERD FOLLOWS FOR: having a rough time right night due to weather changing. Increased Pollen causing her to cough. Staying indoors more because of bad hip. When outside, pollen aggravates her rhinitis. Nasal congestion, retro-orbital pressure headache. Nothing purulent. Using Flonase but  preferred Dymista.  Review of Systems- see HPI Constitutional:   No weight loss, night sweats,  Fevers, chills, fatigue, lassitude. HEENT:  +headaches,  Difficulty swallowing,  Tooth/dental problems,  Sore throat,                No sneezing, itching, ear ache,+nasal congestion, post nasal drip,  CV:  No chest pain, orthopnea, PND, swelling in lower extremities, anasarca, dizziness, palpitations GI  No heartburn, indigestion, abdominal pain, nausea, vomiting,  Resp: No coughing up of blood.  No change in color of mucus.  No wheezing.  Skin: no rash or lesions. GU:  MS:  No joint pain or swelling.  No decreased range of motion.  No back pain. Psych:  No change in mood or affect. No depression or anxiety.  No memory loss.  Objective:   Physical Exam General- Alert, Oriented, Affect-appropriate, Distress- none acute. Quite talkative. Skin- rash-none, lesions- none, excoriation- none Lymphadenopathy- none Head- atraumatic            Eyes- Gross vision intact, PERRLA, conjunctivae clear secretions            Ears- Hearing, canals-normal            Nose- + turbinate edema, no-Septal dev, mucus, polyps, erosion, perforation             Throat- Mallampati II , mucosa clear , drainage- none, tonsils- atrophic Neck- flexible , trachea midline, no stridor , thyroid  nl, carotid no bruit Chest - symmetrical excursion , unlabored           Heart/CV- RRR , 1-2/6 syst murmur LUSB , no gallop  , no rub, nl s1 s2                           - JVD- none , +edema- trace, stasis changes- none, varices- none           Lung- +few crackles, wheeze-none, cough+ dry, with deep breath , dullness-none, rub- none, unlabored.           Chest wall-  Abd- Br/ Gen/ Rectal- Not done, not indicated Extrem- cyanosis- none, clubbing, none, atrophy- none, strength- nl Neuro- grossly intact to observation

## 2013-10-15 NOTE — Patient Instructions (Addendum)
Neb neo nasal  Depo 80  Refill script for Dymista nasal spray sent

## 2013-11-13 NOTE — Assessment & Plan Note (Signed)
Seasonal exacerbation, probably allergic and irritant rhinitis. Watch for bacterial sinusitis. Plan-refill Dymista. Nasal nebulizer decongestant, Depo-Medrol

## 2013-11-13 NOTE — Assessment & Plan Note (Signed)
Current exacerbation is mostly upper respiratory

## 2013-12-08 IMAGING — CR DG CHEST 2V
2 series · 2 of 2 positions shown · non-contrast
Comparison: 04/30/2011

CLINICAL DATA: COPD, cough.

CHEST - 2 VIEW

[view not recorded (1 of 2)]
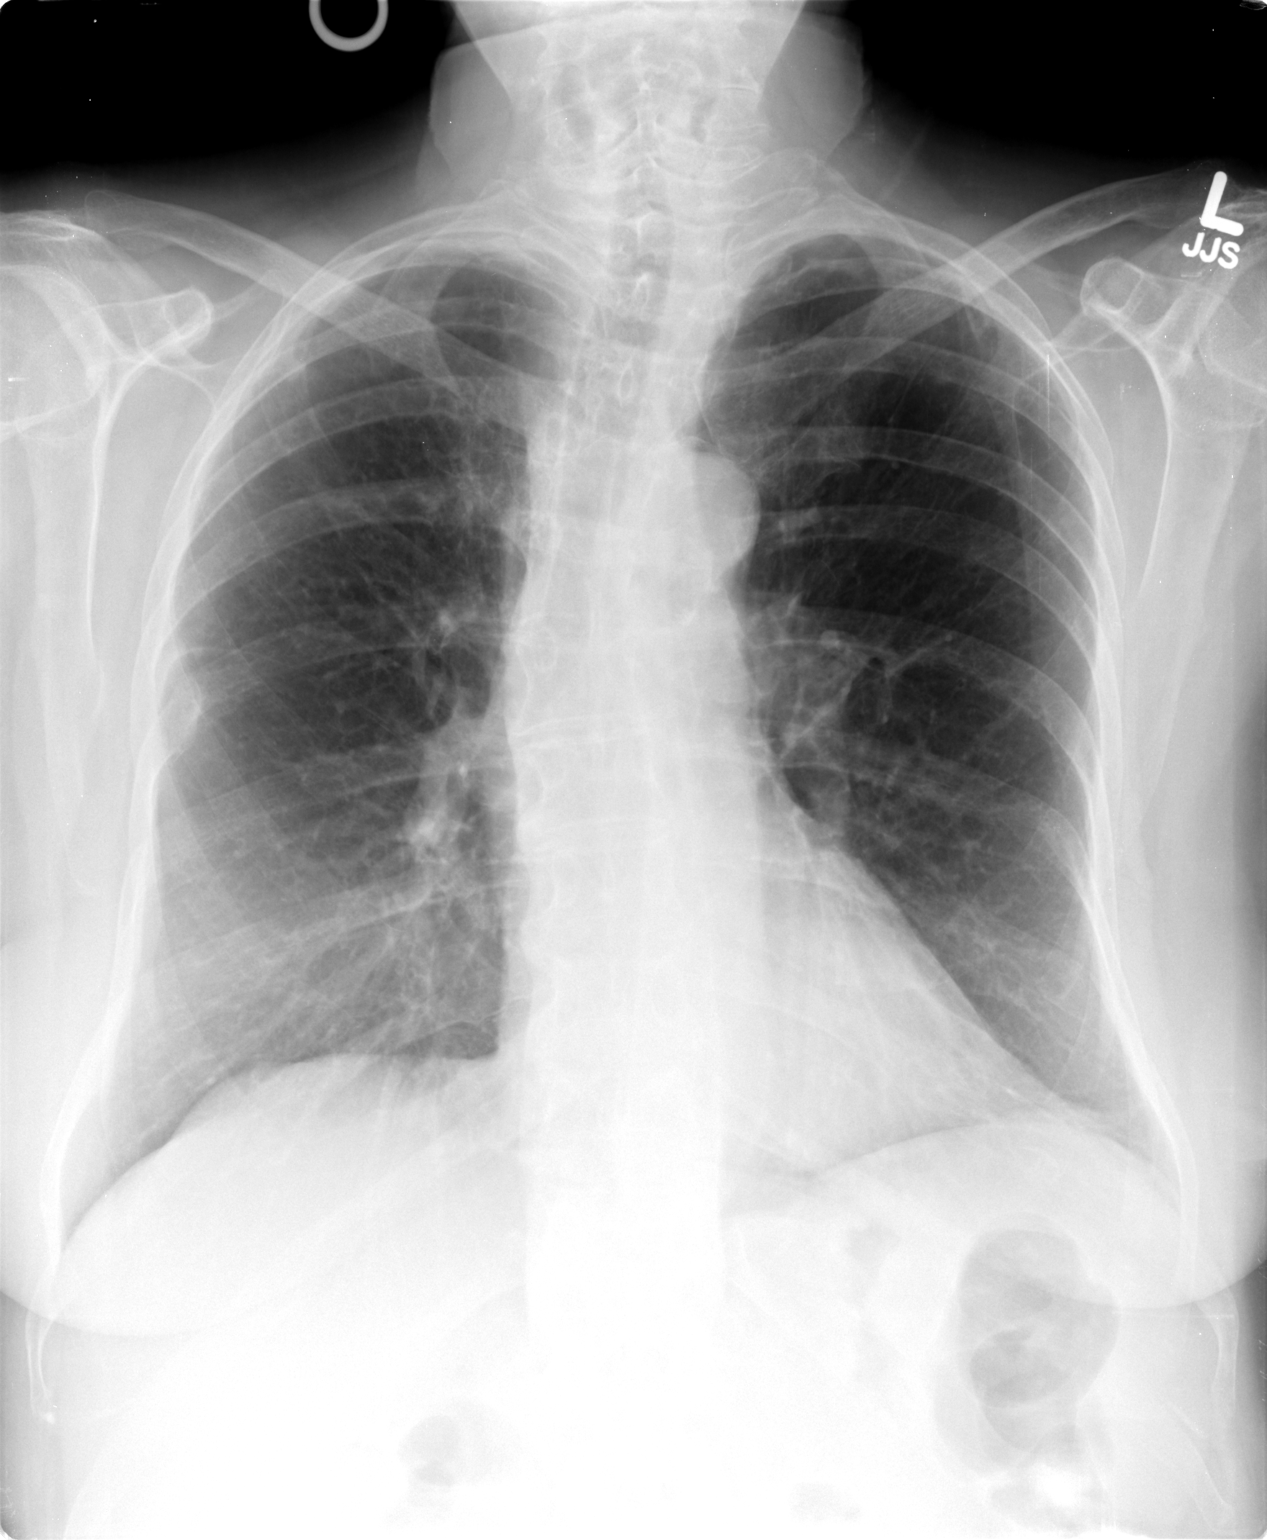

[view not recorded (2 of 2)]
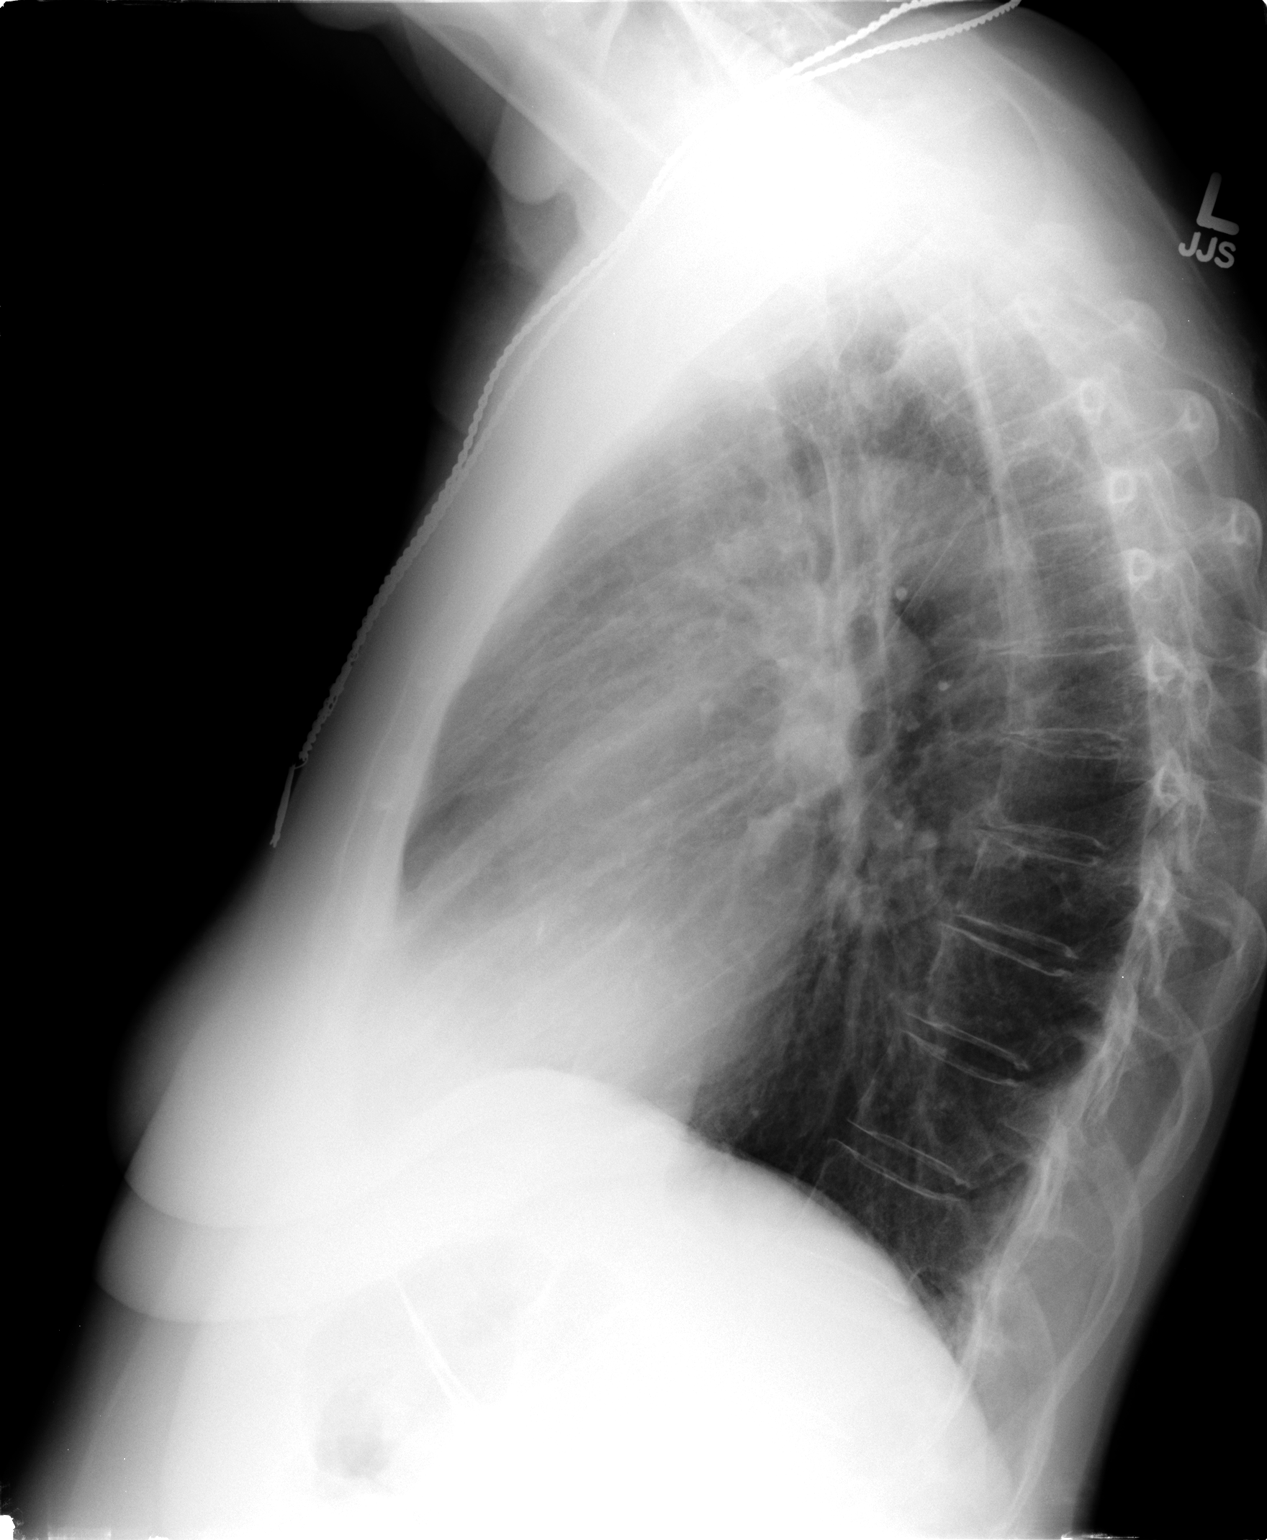

[2 of 2 positions shown; findings below may reference images not displayed]

FINDINGS: Hyperinflation of the lungs.  Mild biapical scarring,
stable.  No acute opacities or effusions.  Heart is normal size.
Old right rib fracture.  No acute bony abnormality.
IMPRESSION: COPD/chronic changes.  No acute findings.

## 2014-04-16 ENCOUNTER — Encounter: Payer: Self-pay | Admitting: Internal Medicine

## 2014-04-16 ENCOUNTER — Ambulatory Visit: Payer: Medicare Other | Admitting: Internal Medicine

## 2014-04-16 VITALS — BP 120/86 | HR 87 | Ht 67.0 in | Wt 157.4 lb

## 2014-04-16 DIAGNOSIS — J449 Chronic obstructive pulmonary disease, unspecified: Secondary | ICD-10-CM

## 2014-04-16 DIAGNOSIS — Z23 Encounter for immunization: Secondary | ICD-10-CM

## 2014-04-16 NOTE — Patient Instructions (Signed)
Flu vax  Depo 80  neb neo nasal   Dx seasonal and perennial allergic rhinitis

## 2014-04-16 NOTE — Progress Notes (Signed)
Patient ID: Ebony Farmer, female    DOB: December 15, 1933, 80 y.o.   MRN: [id number]  HPI 11/26/10- 76 yoFnever smoker, followed for asthmatic bronchitis, hx sinusitis and DVT, complicated by GERD Last here August 28, 2010.- Note reviewed She had taken a long antibiotic course for sinusitis.  Since last here she says she has done pretty well, with no injections or antibiotics. She paces herself more carefully now. Had to close house against early Spring pollen. Fluticasone does help.  DOE varies- somedays can't walk to daughter's house. [facility] Uses nebulizer 4 x daily, plus 2 pulmicort treatments.  Productive cough varies- usually white. May get green from sinuses.   03/02/11- 76 yoFnever smoker, followed for asthmatic bronchitis, hx sinusitis and DVT, complicated by GERD 3-4 days malaise she indicates she may have caught from new infant in family. She got short of breath at store, came home and napped with her oxygen . Today feels better, just no energy. Sinus pressure, sore throat. Feels tighter in chest, some wheeze and dry cough. Denies chest pain, swelling or leg pain.  Dr Sherryll BurgerShah did overnight oximetry to se if she still needed O2 and called her to say she Did still need O2 for sleep.   04/05/11- 76 yoFnever smoker, followed for asthmatic bronchitis, hx sinusitis and DVT, complicated by GERD Needs flu vax has had more than one Pneumovax.  Breathing seems good to her, with dyspnea on hills and stairs.. Still uses O2 (1.5 L per minute/ [facility])at night per Dr Clelia CroftShaw. She takes prednisone only as needed now.  04/30/11- 76 yoFnever smoker, followed for asthmatic bronchitis, hx sinusitis and DVT, complicated by GERD She was feeling much better last visit. Now comfortable sitting but noticing easier dyspnea on exertion especially with hills or stairs. She does not feel up to cleaning her house. Gradual onset without chest pain, palpitation or swelling. Dry hacking  cough. Has oxygen for sleep but not portable. Uses her home nebulizer machine with albuterol and Pulmicort. Denies history of anemia. Has had PFTs in the [place].  06/11/11-  76 yoFnever smoker, followed for asthmatic bronchitis, hx sinusitis and DVT, complicated by GERD Has had flu vaccine. She reports that her son has a history of chronic DVT/PE. Still easy dyspnea on exertion. She did not get overnight oximetry after last visit. Has home oxygen concentrator but wants evaluation for portable oxygen. Complains of sinus infection, frontal headache, postnasal drip. Was treated with a Z-Pak, ending today. Denies fever, chest pain, palpitation or swelling.  05/12/2012 acute w/in ov/Wert cc Pt c/o increased SOB for the past 3-4 days. Also has some prod cough with moderate green sputum-esp in the am. Baseline is not clear, Patient failed to answer a single question asked in a straightforward manner, tending to go off on tangents or answer questions with ambiguous medical terms or diagnoses ("I have copd", not documented in our records) and seemed aggravated to point of being hostile when asked the same question more than once for clarification. Apparently using albuterol as a maint and doesn't articulate any plan on how to titrate it prn.  Sleeping ok without nocturnal or early am exacerbation of respiratory c/o's or need for noct saba. Also denies any obvious fluctuation of symptoms with weather or environmental changes    05/22/12-78 yoFnever smoker, followed for asthmatic bronchitis, hx sinusitis and DVT, complicated by GERD Pt reports breathing,cough and SOB is "about the same" still feels like there is some infection--denies  any other concerns  Here with her granddaughter. Chest congestion with cough productive of  dark green sputum x several weeks. Frontal sinus pressure without headache. Denies fever or sore throat. Uses nebulizer almost every day as instructed. Was given prednisone taper at last visit  November 8 but only took 3 days because it bothered her stomach. CXR 05/21/11 images reviewed IMPRESSION:  Chronic emphysematous and bronchitic type lung changes but no acute  pulmonary findings.  Original Report Authenticated By: P. Loralie Champagne, M.D.   07/24/12- 64 yoF never smoker, followed for asthmatic bronchitis, hx sinusitis and DVT, complicated by GERD Follows For: pt states of having off an on coughing spells,sob,wheezing. feels about the same. denies any fever.  Slow to clear bronchitis after last visit. Dr Sherryll Burger gave antibiotic which helped. Now she says that for her she is doing quite well. She liked Dymista.  01/22/13- 79 yoF never smoker, followed for asthmatic bronchitis, hx sinusitis and DVT, complicated by GERD Reports breathing is doing well. No acute concerns. Primary physician extender prednisone and antibiotics this summer. Now at baseline, noticing that she tires easily with dyspnea on exertion. She blames lack of regular exercise. Home oxygen 2 L for sleep. Expects Dr. Clelia Croft to get chest x-ray with upcoming physical.  04/16/13- 79 yoF never smoker, followed for asthmatic bronchitis, hx sinusitis and DVT, complicated by GERD FOLLOWS FOR:  Sinus pressure active and breathing has improved since last OV Prednisone called in 3 or 4 weeks ago did help bronchitis. Now complains of retro-orbital pressure and nasal congestion- no fever or purulent discharge. Chest feels clear. CXR 07/25/12 IMPRESSION:  COPD/chronic changes. No acute findings.  Original Report Authenticated By: Charlett Nose, M.D.  10/15/13- 38 yoF never smoker, followed for asthmatic bronchitis, hx sinusitis and DVT, complicated by GERD FOLLOWS FOR: having a rough time right night due to weather changing. Increased Pollen causing her to cough. Staying indoors more because of bad hip. When outside, pollen aggravates her rhinitis. Nasal congestion, retro-orbital pressure headache. Nothing purulent. Using Flonase but  preferred Dymista  04/16/14-80 yoF never smoker, followed for asthmatic bronchitis, hx sinusitis and DVT, complicated by GERD FOLLOWS FOR: Pt states she gets winded when walking but at rest she is fine     Review of Systems- see HPI Constitutional:   No weight loss, night sweats,  Fevers, chills, fatigue, lassitude. HEENT:  +headaches,  Difficulty swallowing,  Tooth/dental problems,  Sore throat,                No sneezing, itching, ear ache,+nasal congestion, post nasal drip,  CV:  No chest pain, orthopnea, PND, swelling in lower extremities, anasarca, dizziness, palpitations GI  No heartburn, indigestion, abdominal pain, nausea, vomiting,  Resp: No coughing up of blood.  No change in color of mucus.  No wheezing.  Skin: no rash or lesions. GU:  MS:  No joint pain or swelling.  No decreased range of motion.  No back pain. Psych:  No change in mood or affect. No depression or anxiety.  No memory loss.  Objective:   Physical Exam General- Alert, Oriented, Affect-appropriate, Distress- none acute. Quite talkative. Skin- rash-none, lesions- none, excoriation- none Lymphadenopathy- none Head- atraumatic            Eyes- Gross vision intact, PERRLA, conjunctivae clear secretions            Ears- Hearing, canals-normal            Nose- + turbinate edema, no-Septal dev, mucus, polyps,  erosion, perforation             Throat- Mallampati II , mucosa clear , drainage- none, tonsils- atrophic Neck- flexible , trachea midline, no stridor , thyroid nl, carotid no bruit Chest - symmetrical excursion , unlabored           Heart/CV- RRR , 1-2/6 syst murmur LUSB , no gallop  , no rub, nl s1 s2                           - JVD- none , +edema- trace, stasis changes- none, varices- none           Lung- +few crackles, wheeze-none, cough+ dry, with deep breath , dullness-none, rub- none, unlabored.           Chest wall-  Abd- Br/ Gen/ Rectal- Not done, not indicated Extrem- cyanosis- none, clubbing,  none, atrophy- none, strength- nl Neuro- grossly intact to observation

## 2014-05-03 MED ORDER — PHENYLEPHRINE HCL 1 % NA SOLN
3.0000 [drp] | Freq: Once | NASAL | Status: AC
  Administered 2014-05-03: 3 [drp] via NASAL

## 2014-05-03 MED ORDER — METHYLPREDNISOLONE ACETATE 80 MG/ML IJ SUSP
80.0000 mg | Freq: Once | INTRAMUSCULAR | Status: AC
  Administered 2014-05-03: 80 mg via INTRAMUSCULAR

## 2014-10-15 ENCOUNTER — Ambulatory Visit: Payer: Medicare Other | Admitting: Internal Medicine

## 2014-10-16 ENCOUNTER — Ambulatory Visit: Payer: Medicare Other | Admitting: Internal Medicine

## 2015-07-18 DIAGNOSIS — I82409 Acute embolism and thrombosis of unspecified deep veins of unspecified lower extremity: Secondary | ICD-10-CM | POA: Diagnosis not present

## 2015-07-18 DIAGNOSIS — Z789 Other specified health status: Secondary | ICD-10-CM | POA: Diagnosis not present

## 2015-07-18 DIAGNOSIS — J0100 Acute maxillary sinusitis, unspecified: Secondary | ICD-10-CM | POA: Diagnosis not present

## 2015-07-18 DIAGNOSIS — Z6822 Body mass index (BMI) 22.0-22.9, adult: Secondary | ICD-10-CM | POA: Diagnosis not present

## 2015-08-26 DIAGNOSIS — R05 Cough: Secondary | ICD-10-CM | POA: Diagnosis not present

## 2015-08-26 DIAGNOSIS — R0602 Shortness of breath: Secondary | ICD-10-CM | POA: Diagnosis not present

## 2015-08-26 DIAGNOSIS — J329 Chronic sinusitis, unspecified: Secondary | ICD-10-CM | POA: Diagnosis not present

## 2015-08-26 DIAGNOSIS — R067 Sneezing: Secondary | ICD-10-CM | POA: Diagnosis not present

## 2015-08-26 DIAGNOSIS — J301 Allergic rhinitis due to pollen: Secondary | ICD-10-CM | POA: Diagnosis not present

## 2015-08-26 DIAGNOSIS — J45909 Unspecified asthma, uncomplicated: Secondary | ICD-10-CM | POA: Diagnosis not present

## 2015-08-28 DIAGNOSIS — J301 Allergic rhinitis due to pollen: Secondary | ICD-10-CM | POA: Diagnosis not present

## 2015-08-28 DIAGNOSIS — J4540 Moderate persistent asthma, uncomplicated: Secondary | ICD-10-CM | POA: Diagnosis not present

## 2015-08-28 DIAGNOSIS — J329 Chronic sinusitis, unspecified: Secondary | ICD-10-CM | POA: Diagnosis not present

## 2015-10-08 DIAGNOSIS — I80209 Phlebitis and thrombophlebitis of unspecified deep vessels of unspecified lower extremity: Secondary | ICD-10-CM | POA: Diagnosis not present

## 2015-10-08 DIAGNOSIS — I10 Essential (primary) hypertension: Secondary | ICD-10-CM | POA: Diagnosis not present

## 2015-10-08 DIAGNOSIS — J45909 Unspecified asthma, uncomplicated: Secondary | ICD-10-CM | POA: Diagnosis not present

## 2015-10-12 DIAGNOSIS — Z79899 Other long term (current) drug therapy: Secondary | ICD-10-CM | POA: Diagnosis not present

## 2015-10-12 DIAGNOSIS — T45515A Adverse effect of anticoagulants, initial encounter: Secondary | ICD-10-CM | POA: Diagnosis not present

## 2015-10-12 DIAGNOSIS — Z7901 Long term (current) use of anticoagulants: Secondary | ICD-10-CM | POA: Diagnosis not present

## 2015-10-12 DIAGNOSIS — Z86718 Personal history of other venous thrombosis and embolism: Secondary | ICD-10-CM | POA: Diagnosis not present

## 2015-10-12 DIAGNOSIS — J449 Chronic obstructive pulmonary disease, unspecified: Secondary | ICD-10-CM | POA: Diagnosis not present

## 2015-10-12 DIAGNOSIS — T50901A Poisoning by unspecified drugs, medicaments and biological substances, accidental (unintentional), initial encounter: Secondary | ICD-10-CM | POA: Diagnosis not present

## 2015-10-12 DIAGNOSIS — I10 Essential (primary) hypertension: Secondary | ICD-10-CM | POA: Diagnosis not present

## 2015-10-12 DIAGNOSIS — T887XXA Unspecified adverse effect of drug or medicament, initial encounter: Secondary | ICD-10-CM | POA: Diagnosis not present

## 2015-10-13 DIAGNOSIS — W19XXXA Unspecified fall, initial encounter: Secondary | ICD-10-CM | POA: Diagnosis not present

## 2015-10-13 DIAGNOSIS — R5383 Other fatigue: Secondary | ICD-10-CM | POA: Diagnosis not present

## 2015-10-13 DIAGNOSIS — Z299 Encounter for prophylactic measures, unspecified: Secondary | ICD-10-CM | POA: Diagnosis not present

## 2015-10-13 DIAGNOSIS — Y92099 Unspecified place in other non-institutional residence as the place of occurrence of the external cause: Secondary | ICD-10-CM | POA: Diagnosis not present

## 2015-10-30 DIAGNOSIS — J45901 Unspecified asthma with (acute) exacerbation: Secondary | ICD-10-CM | POA: Diagnosis not present

## 2015-10-30 DIAGNOSIS — J301 Allergic rhinitis due to pollen: Secondary | ICD-10-CM | POA: Diagnosis not present

## 2015-11-06 DIAGNOSIS — I82409 Acute embolism and thrombosis of unspecified deep veins of unspecified lower extremity: Secondary | ICD-10-CM | POA: Diagnosis not present

## 2015-11-14 ENCOUNTER — Ambulatory Visit: Payer: Medicare Other | Admitting: Neurology

## 2015-11-14 DIAGNOSIS — Z029 Encounter for administrative examinations, unspecified: Secondary | ICD-10-CM

## 2015-11-24 DIAGNOSIS — J449 Chronic obstructive pulmonary disease, unspecified: Secondary | ICD-10-CM | POA: Diagnosis not present

## 2015-11-24 DIAGNOSIS — I10 Essential (primary) hypertension: Secondary | ICD-10-CM | POA: Diagnosis not present

## 2015-12-24 DIAGNOSIS — J45901 Unspecified asthma with (acute) exacerbation: Secondary | ICD-10-CM | POA: Diagnosis not present

## 2015-12-24 DIAGNOSIS — J45909 Unspecified asthma, uncomplicated: Secondary | ICD-10-CM | POA: Diagnosis not present

## 2015-12-24 DIAGNOSIS — J301 Allergic rhinitis due to pollen: Secondary | ICD-10-CM | POA: Diagnosis not present

## 2015-12-24 DIAGNOSIS — J324 Chronic pansinusitis: Secondary | ICD-10-CM | POA: Diagnosis not present

## 2015-12-30 DIAGNOSIS — J449 Chronic obstructive pulmonary disease, unspecified: Secondary | ICD-10-CM | POA: Diagnosis not present

## 2015-12-30 DIAGNOSIS — I10 Essential (primary) hypertension: Secondary | ICD-10-CM | POA: Diagnosis not present

## 2016-01-16 DIAGNOSIS — J449 Chronic obstructive pulmonary disease, unspecified: Secondary | ICD-10-CM | POA: Diagnosis not present

## 2016-01-16 DIAGNOSIS — W458XXA Other foreign body or object entering through skin, initial encounter: Secondary | ICD-10-CM | POA: Diagnosis not present

## 2016-01-16 DIAGNOSIS — I10 Essential (primary) hypertension: Secondary | ICD-10-CM | POA: Diagnosis not present

## 2016-01-16 DIAGNOSIS — T1502XA Foreign body in cornea, left eye, initial encounter: Secondary | ICD-10-CM | POA: Diagnosis not present

## 2016-01-16 DIAGNOSIS — H538 Other visual disturbances: Secondary | ICD-10-CM | POA: Diagnosis not present

## 2016-01-16 DIAGNOSIS — Z86718 Personal history of other venous thrombosis and embolism: Secondary | ICD-10-CM | POA: Diagnosis not present

## 2016-01-16 DIAGNOSIS — T1592XA Foreign body on external eye, part unspecified, left eye, initial encounter: Secondary | ICD-10-CM | POA: Diagnosis not present

## 2016-01-16 DIAGNOSIS — Z79899 Other long term (current) drug therapy: Secondary | ICD-10-CM | POA: Diagnosis not present

## 2016-01-16 DIAGNOSIS — Z7984 Long term (current) use of oral hypoglycemic drugs: Secondary | ICD-10-CM | POA: Diagnosis not present

## 2016-02-02 DIAGNOSIS — I10 Essential (primary) hypertension: Secondary | ICD-10-CM | POA: Diagnosis not present

## 2016-02-02 DIAGNOSIS — J449 Chronic obstructive pulmonary disease, unspecified: Secondary | ICD-10-CM | POA: Diagnosis not present

## 2016-02-12 DIAGNOSIS — Z789 Other specified health status: Secondary | ICD-10-CM | POA: Diagnosis not present

## 2016-02-12 DIAGNOSIS — I82409 Acute embolism and thrombosis of unspecified deep veins of unspecified lower extremity: Secondary | ICD-10-CM | POA: Diagnosis not present

## 2016-02-12 DIAGNOSIS — J019 Acute sinusitis, unspecified: Secondary | ICD-10-CM | POA: Diagnosis not present

## 2016-02-12 DIAGNOSIS — M549 Dorsalgia, unspecified: Secondary | ICD-10-CM | POA: Diagnosis not present

## 2016-02-12 DIAGNOSIS — J45909 Unspecified asthma, uncomplicated: Secondary | ICD-10-CM | POA: Diagnosis not present

## 2016-02-17 DIAGNOSIS — J441 Chronic obstructive pulmonary disease with (acute) exacerbation: Secondary | ICD-10-CM | POA: Diagnosis not present

## 2016-02-17 DIAGNOSIS — Z789 Other specified health status: Secondary | ICD-10-CM | POA: Diagnosis not present

## 2016-03-16 DIAGNOSIS — Z90710 Acquired absence of both cervix and uterus: Secondary | ICD-10-CM | POA: Diagnosis not present

## 2016-03-16 DIAGNOSIS — M25559 Pain in unspecified hip: Secondary | ICD-10-CM | POA: Diagnosis not present

## 2016-03-26 DIAGNOSIS — Z789 Other specified health status: Secondary | ICD-10-CM | POA: Diagnosis not present

## 2016-03-26 DIAGNOSIS — Z713 Dietary counseling and surveillance: Secondary | ICD-10-CM | POA: Diagnosis not present

## 2016-03-26 DIAGNOSIS — I82409 Acute embolism and thrombosis of unspecified deep veins of unspecified lower extremity: Secondary | ICD-10-CM | POA: Diagnosis not present

## 2016-03-26 DIAGNOSIS — J019 Acute sinusitis, unspecified: Secondary | ICD-10-CM | POA: Diagnosis not present

## 2016-04-15 DIAGNOSIS — Z7951 Long term (current) use of inhaled steroids: Secondary | ICD-10-CM | POA: Diagnosis not present

## 2016-04-15 DIAGNOSIS — J0111 Acute recurrent frontal sinusitis: Secondary | ICD-10-CM | POA: Diagnosis not present

## 2016-04-15 DIAGNOSIS — J45909 Unspecified asthma, uncomplicated: Secondary | ICD-10-CM | POA: Diagnosis not present

## 2016-04-15 DIAGNOSIS — Z23 Encounter for immunization: Secondary | ICD-10-CM | POA: Diagnosis not present

## 2016-04-29 DIAGNOSIS — J45909 Unspecified asthma, uncomplicated: Secondary | ICD-10-CM | POA: Diagnosis not present

## 2016-04-30 DIAGNOSIS — J449 Chronic obstructive pulmonary disease, unspecified: Secondary | ICD-10-CM | POA: Diagnosis not present

## 2016-04-30 DIAGNOSIS — J45909 Unspecified asthma, uncomplicated: Secondary | ICD-10-CM | POA: Diagnosis not present

## 2016-04-30 DIAGNOSIS — I10 Essential (primary) hypertension: Secondary | ICD-10-CM | POA: Diagnosis not present

## 2016-05-03 DIAGNOSIS — I82409 Acute embolism and thrombosis of unspecified deep veins of unspecified lower extremity: Secondary | ICD-10-CM | POA: Diagnosis not present

## 2016-05-03 DIAGNOSIS — M25559 Pain in unspecified hip: Secondary | ICD-10-CM | POA: Diagnosis not present

## 2016-05-03 DIAGNOSIS — Z6821 Body mass index (BMI) 21.0-21.9, adult: Secondary | ICD-10-CM | POA: Diagnosis not present

## 2016-05-03 DIAGNOSIS — Z299 Encounter for prophylactic measures, unspecified: Secondary | ICD-10-CM | POA: Diagnosis not present

## 2016-05-03 DIAGNOSIS — J45909 Unspecified asthma, uncomplicated: Secondary | ICD-10-CM | POA: Diagnosis not present

## 2016-05-24 DIAGNOSIS — I10 Essential (primary) hypertension: Secondary | ICD-10-CM | POA: Diagnosis not present

## 2016-05-24 DIAGNOSIS — J449 Chronic obstructive pulmonary disease, unspecified: Secondary | ICD-10-CM | POA: Diagnosis not present

## 2016-05-28 DIAGNOSIS — Z299 Encounter for prophylactic measures, unspecified: Secondary | ICD-10-CM | POA: Diagnosis not present

## 2016-05-28 DIAGNOSIS — Z789 Other specified health status: Secondary | ICD-10-CM | POA: Diagnosis not present

## 2016-05-28 DIAGNOSIS — Z713 Dietary counseling and surveillance: Secondary | ICD-10-CM | POA: Diagnosis not present

## 2016-05-28 DIAGNOSIS — Z6822 Body mass index (BMI) 22.0-22.9, adult: Secondary | ICD-10-CM | POA: Diagnosis not present

## 2016-05-28 DIAGNOSIS — J069 Acute upper respiratory infection, unspecified: Secondary | ICD-10-CM | POA: Diagnosis not present

## 2016-06-07 DIAGNOSIS — I10 Essential (primary) hypertension: Secondary | ICD-10-CM | POA: Diagnosis not present

## 2016-06-07 DIAGNOSIS — J449 Chronic obstructive pulmonary disease, unspecified: Secondary | ICD-10-CM | POA: Diagnosis not present

## 2016-06-08 DIAGNOSIS — J4541 Moderate persistent asthma with (acute) exacerbation: Secondary | ICD-10-CM | POA: Diagnosis not present

## 2016-06-08 DIAGNOSIS — J019 Acute sinusitis, unspecified: Secondary | ICD-10-CM | POA: Diagnosis not present

## 2016-07-27 DIAGNOSIS — R05 Cough: Secondary | ICD-10-CM | POA: Diagnosis not present

## 2016-07-27 DIAGNOSIS — I82409 Acute embolism and thrombosis of unspecified deep veins of unspecified lower extremity: Secondary | ICD-10-CM | POA: Diagnosis not present

## 2016-07-27 DIAGNOSIS — J069 Acute upper respiratory infection, unspecified: Secondary | ICD-10-CM | POA: Diagnosis not present

## 2016-07-27 DIAGNOSIS — H9201 Otalgia, right ear: Secondary | ICD-10-CM | POA: Diagnosis not present

## 2016-07-27 DIAGNOSIS — R0981 Nasal congestion: Secondary | ICD-10-CM | POA: Diagnosis not present

## 2016-08-09 DIAGNOSIS — M25651 Stiffness of right hip, not elsewhere classified: Secondary | ICD-10-CM | POA: Diagnosis not present

## 2016-08-09 DIAGNOSIS — L723 Sebaceous cyst: Secondary | ICD-10-CM | POA: Diagnosis not present

## 2016-08-09 DIAGNOSIS — F0280 Dementia in other diseases classified elsewhere without behavioral disturbance: Secondary | ICD-10-CM | POA: Diagnosis not present

## 2016-08-09 DIAGNOSIS — Z6822 Body mass index (BMI) 22.0-22.9, adult: Secondary | ICD-10-CM | POA: Diagnosis not present

## 2016-08-09 DIAGNOSIS — M25652 Stiffness of left hip, not elsewhere classified: Secondary | ICD-10-CM | POA: Diagnosis not present

## 2016-08-09 DIAGNOSIS — Z789 Other specified health status: Secondary | ICD-10-CM | POA: Diagnosis not present

## 2016-08-09 DIAGNOSIS — G309 Alzheimer's disease, unspecified: Secondary | ICD-10-CM | POA: Diagnosis not present

## 2016-08-09 DIAGNOSIS — I82409 Acute embolism and thrombosis of unspecified deep veins of unspecified lower extremity: Secondary | ICD-10-CM | POA: Diagnosis not present

## 2016-08-09 DIAGNOSIS — I10 Essential (primary) hypertension: Secondary | ICD-10-CM | POA: Diagnosis not present

## 2016-08-09 DIAGNOSIS — J449 Chronic obstructive pulmonary disease, unspecified: Secondary | ICD-10-CM | POA: Diagnosis not present

## 2016-08-09 DIAGNOSIS — Z299 Encounter for prophylactic measures, unspecified: Secondary | ICD-10-CM | POA: Diagnosis not present

## 2016-08-09 DIAGNOSIS — L089 Local infection of the skin and subcutaneous tissue, unspecified: Secondary | ICD-10-CM | POA: Diagnosis not present

## 2016-08-11 DIAGNOSIS — J449 Chronic obstructive pulmonary disease, unspecified: Secondary | ICD-10-CM | POA: Diagnosis not present

## 2016-08-11 DIAGNOSIS — I10 Essential (primary) hypertension: Secondary | ICD-10-CM | POA: Diagnosis not present

## 2016-08-11 DIAGNOSIS — Z86711 Personal history of pulmonary embolism: Secondary | ICD-10-CM | POA: Diagnosis not present

## 2016-08-11 DIAGNOSIS — T45511A Poisoning by anticoagulants, accidental (unintentional), initial encounter: Secondary | ICD-10-CM | POA: Diagnosis not present

## 2016-08-11 DIAGNOSIS — R1110 Vomiting, unspecified: Secondary | ICD-10-CM | POA: Diagnosis not present

## 2016-08-17 DIAGNOSIS — L729 Follicular cyst of the skin and subcutaneous tissue, unspecified: Secondary | ICD-10-CM | POA: Diagnosis not present

## 2016-08-19 DIAGNOSIS — L7211 Pilar cyst: Secondary | ICD-10-CM | POA: Diagnosis not present

## 2016-08-19 DIAGNOSIS — L729 Follicular cyst of the skin and subcutaneous tissue, unspecified: Secondary | ICD-10-CM | POA: Diagnosis not present

## 2016-08-24 DIAGNOSIS — J449 Chronic obstructive pulmonary disease, unspecified: Secondary | ICD-10-CM | POA: Diagnosis not present

## 2016-08-24 DIAGNOSIS — I10 Essential (primary) hypertension: Secondary | ICD-10-CM | POA: Diagnosis not present

## 2016-08-28 DIAGNOSIS — J449 Chronic obstructive pulmonary disease, unspecified: Secondary | ICD-10-CM | POA: Diagnosis not present

## 2016-08-28 DIAGNOSIS — Z86718 Personal history of other venous thrombosis and embolism: Secondary | ICD-10-CM | POA: Diagnosis not present

## 2016-08-28 DIAGNOSIS — T50901A Poisoning by unspecified drugs, medicaments and biological substances, accidental (unintentional), initial encounter: Secondary | ICD-10-CM | POA: Diagnosis not present

## 2016-08-28 DIAGNOSIS — T45511A Poisoning by anticoagulants, accidental (unintentional), initial encounter: Secondary | ICD-10-CM | POA: Diagnosis not present

## 2016-08-28 DIAGNOSIS — Z79899 Other long term (current) drug therapy: Secondary | ICD-10-CM | POA: Diagnosis not present

## 2016-08-28 DIAGNOSIS — Z7901 Long term (current) use of anticoagulants: Secondary | ICD-10-CM | POA: Diagnosis not present

## 2016-08-28 DIAGNOSIS — I10 Essential (primary) hypertension: Secondary | ICD-10-CM | POA: Diagnosis not present

## 2016-09-13 DIAGNOSIS — I10 Essential (primary) hypertension: Secondary | ICD-10-CM | POA: Diagnosis not present

## 2016-09-13 DIAGNOSIS — J449 Chronic obstructive pulmonary disease, unspecified: Secondary | ICD-10-CM | POA: Diagnosis not present

## 2016-09-14 DIAGNOSIS — J45909 Unspecified asthma, uncomplicated: Secondary | ICD-10-CM | POA: Diagnosis not present

## 2016-09-14 DIAGNOSIS — Z713 Dietary counseling and surveillance: Secondary | ICD-10-CM | POA: Diagnosis not present

## 2016-09-14 DIAGNOSIS — Z789 Other specified health status: Secondary | ICD-10-CM | POA: Diagnosis not present

## 2016-09-14 DIAGNOSIS — Z6821 Body mass index (BMI) 21.0-21.9, adult: Secondary | ICD-10-CM | POA: Diagnosis not present

## 2016-09-14 DIAGNOSIS — G309 Alzheimer's disease, unspecified: Secondary | ICD-10-CM | POA: Diagnosis not present

## 2016-09-14 DIAGNOSIS — M549 Dorsalgia, unspecified: Secondary | ICD-10-CM | POA: Diagnosis not present

## 2016-09-14 DIAGNOSIS — K4090 Unilateral inguinal hernia, without obstruction or gangrene, not specified as recurrent: Secondary | ICD-10-CM | POA: Diagnosis not present

## 2016-09-14 DIAGNOSIS — I82409 Acute embolism and thrombosis of unspecified deep veins of unspecified lower extremity: Secondary | ICD-10-CM | POA: Diagnosis not present

## 2016-09-14 DIAGNOSIS — F0280 Dementia in other diseases classified elsewhere without behavioral disturbance: Secondary | ICD-10-CM | POA: Diagnosis not present

## 2016-09-14 DIAGNOSIS — I071 Rheumatic tricuspid insufficiency: Secondary | ICD-10-CM | POA: Diagnosis not present

## 2016-09-14 DIAGNOSIS — Z299 Encounter for prophylactic measures, unspecified: Secondary | ICD-10-CM | POA: Diagnosis not present

## 2016-09-14 DIAGNOSIS — I10 Essential (primary) hypertension: Secondary | ICD-10-CM | POA: Diagnosis not present

## 2016-09-17 DIAGNOSIS — J45909 Unspecified asthma, uncomplicated: Secondary | ICD-10-CM | POA: Diagnosis not present

## 2016-09-19 DIAGNOSIS — E860 Dehydration: Secondary | ICD-10-CM | POA: Diagnosis not present

## 2016-09-19 DIAGNOSIS — F0281 Dementia in other diseases classified elsewhere with behavioral disturbance: Secondary | ICD-10-CM | POA: Diagnosis not present

## 2016-09-19 DIAGNOSIS — Z7901 Long term (current) use of anticoagulants: Secondary | ICD-10-CM | POA: Diagnosis not present

## 2016-09-19 DIAGNOSIS — J449 Chronic obstructive pulmonary disease, unspecified: Secondary | ICD-10-CM | POA: Diagnosis not present

## 2016-09-19 DIAGNOSIS — G301 Alzheimer's disease with late onset: Secondary | ICD-10-CM | POA: Diagnosis not present

## 2016-09-19 DIAGNOSIS — I10 Essential (primary) hypertension: Secondary | ICD-10-CM | POA: Diagnosis not present

## 2016-09-19 DIAGNOSIS — Z86718 Personal history of other venous thrombosis and embolism: Secondary | ICD-10-CM | POA: Diagnosis not present

## 2016-09-19 DIAGNOSIS — R9431 Abnormal electrocardiogram [ECG] [EKG]: Secondary | ICD-10-CM | POA: Diagnosis not present

## 2016-09-19 DIAGNOSIS — Z79899 Other long term (current) drug therapy: Secondary | ICD-10-CM | POA: Diagnosis not present

## 2016-09-20 DIAGNOSIS — G309 Alzheimer's disease, unspecified: Secondary | ICD-10-CM | POA: Diagnosis not present

## 2016-09-20 DIAGNOSIS — Z713 Dietary counseling and surveillance: Secondary | ICD-10-CM | POA: Diagnosis not present

## 2016-09-20 DIAGNOSIS — Z6821 Body mass index (BMI) 21.0-21.9, adult: Secondary | ICD-10-CM | POA: Diagnosis not present

## 2016-09-20 DIAGNOSIS — Z299 Encounter for prophylactic measures, unspecified: Secondary | ICD-10-CM | POA: Diagnosis not present

## 2016-09-20 DIAGNOSIS — J449 Chronic obstructive pulmonary disease, unspecified: Secondary | ICD-10-CM | POA: Diagnosis not present

## 2016-09-20 DIAGNOSIS — I82409 Acute embolism and thrombosis of unspecified deep veins of unspecified lower extremity: Secondary | ICD-10-CM | POA: Diagnosis not present

## 2016-09-20 DIAGNOSIS — I10 Essential (primary) hypertension: Secondary | ICD-10-CM | POA: Diagnosis not present

## 2016-09-20 DIAGNOSIS — F0280 Dementia in other diseases classified elsewhere without behavioral disturbance: Secondary | ICD-10-CM | POA: Diagnosis not present

## 2016-09-23 DIAGNOSIS — Z299 Encounter for prophylactic measures, unspecified: Secondary | ICD-10-CM | POA: Diagnosis not present

## 2016-09-23 DIAGNOSIS — I10 Essential (primary) hypertension: Secondary | ICD-10-CM | POA: Diagnosis not present

## 2016-09-23 DIAGNOSIS — Z6821 Body mass index (BMI) 21.0-21.9, adult: Secondary | ICD-10-CM | POA: Diagnosis not present

## 2016-09-23 DIAGNOSIS — Z713 Dietary counseling and surveillance: Secondary | ICD-10-CM | POA: Diagnosis not present

## 2016-09-23 DIAGNOSIS — G309 Alzheimer's disease, unspecified: Secondary | ICD-10-CM | POA: Diagnosis not present

## 2016-09-23 DIAGNOSIS — F0280 Dementia in other diseases classified elsewhere without behavioral disturbance: Secondary | ICD-10-CM | POA: Diagnosis not present

## 2016-09-23 DIAGNOSIS — J449 Chronic obstructive pulmonary disease, unspecified: Secondary | ICD-10-CM | POA: Diagnosis not present

## 2016-09-23 DIAGNOSIS — Z789 Other specified health status: Secondary | ICD-10-CM | POA: Diagnosis not present

## 2016-09-23 DIAGNOSIS — J441 Chronic obstructive pulmonary disease with (acute) exacerbation: Secondary | ICD-10-CM | POA: Diagnosis not present

## 2016-10-15 DIAGNOSIS — I10 Essential (primary) hypertension: Secondary | ICD-10-CM | POA: Diagnosis not present

## 2016-10-15 DIAGNOSIS — J449 Chronic obstructive pulmonary disease, unspecified: Secondary | ICD-10-CM | POA: Diagnosis not present

## 2016-10-29 DIAGNOSIS — Z789 Other specified health status: Secondary | ICD-10-CM | POA: Diagnosis not present

## 2016-10-29 DIAGNOSIS — J309 Allergic rhinitis, unspecified: Secondary | ICD-10-CM | POA: Diagnosis not present

## 2016-10-29 DIAGNOSIS — E7800 Pure hypercholesterolemia, unspecified: Secondary | ICD-10-CM | POA: Diagnosis not present

## 2016-10-29 DIAGNOSIS — G309 Alzheimer's disease, unspecified: Secondary | ICD-10-CM | POA: Diagnosis not present

## 2016-10-29 DIAGNOSIS — Z299 Encounter for prophylactic measures, unspecified: Secondary | ICD-10-CM | POA: Diagnosis not present

## 2016-10-29 DIAGNOSIS — F0280 Dementia in other diseases classified elsewhere without behavioral disturbance: Secondary | ICD-10-CM | POA: Diagnosis not present

## 2016-10-29 DIAGNOSIS — Z6822 Body mass index (BMI) 22.0-22.9, adult: Secondary | ICD-10-CM | POA: Diagnosis not present

## 2016-11-08 DIAGNOSIS — I82409 Acute embolism and thrombosis of unspecified deep veins of unspecified lower extremity: Secondary | ICD-10-CM | POA: Diagnosis not present

## 2016-11-08 DIAGNOSIS — Z299 Encounter for prophylactic measures, unspecified: Secondary | ICD-10-CM | POA: Diagnosis not present

## 2016-11-08 DIAGNOSIS — I10 Essential (primary) hypertension: Secondary | ICD-10-CM | POA: Diagnosis not present

## 2016-11-08 DIAGNOSIS — Z6822 Body mass index (BMI) 22.0-22.9, adult: Secondary | ICD-10-CM | POA: Diagnosis not present

## 2016-11-08 DIAGNOSIS — Z713 Dietary counseling and surveillance: Secondary | ICD-10-CM | POA: Diagnosis not present

## 2016-11-12 DIAGNOSIS — I10 Essential (primary) hypertension: Secondary | ICD-10-CM | POA: Diagnosis not present

## 2016-11-12 DIAGNOSIS — J449 Chronic obstructive pulmonary disease, unspecified: Secondary | ICD-10-CM | POA: Diagnosis not present

## 2016-11-13 DIAGNOSIS — I6782 Cerebral ischemia: Secondary | ICD-10-CM | POA: Diagnosis not present

## 2016-11-13 DIAGNOSIS — I10 Essential (primary) hypertension: Secondary | ICD-10-CM | POA: Diagnosis not present

## 2016-11-13 DIAGNOSIS — J449 Chronic obstructive pulmonary disease, unspecified: Secondary | ICD-10-CM | POA: Diagnosis not present

## 2016-11-13 DIAGNOSIS — Z79899 Other long term (current) drug therapy: Secondary | ICD-10-CM | POA: Diagnosis not present

## 2016-11-13 DIAGNOSIS — R42 Dizziness and giddiness: Secondary | ICD-10-CM | POA: Diagnosis not present

## 2016-11-13 DIAGNOSIS — J0100 Acute maxillary sinusitis, unspecified: Secondary | ICD-10-CM | POA: Diagnosis not present

## 2016-11-13 DIAGNOSIS — Z86718 Personal history of other venous thrombosis and embolism: Secondary | ICD-10-CM | POA: Diagnosis not present

## 2016-11-13 DIAGNOSIS — H9313 Tinnitus, bilateral: Secondary | ICD-10-CM | POA: Diagnosis not present

## 2016-11-16 DIAGNOSIS — I10 Essential (primary) hypertension: Secondary | ICD-10-CM | POA: Diagnosis not present

## 2016-11-16 DIAGNOSIS — F0280 Dementia in other diseases classified elsewhere without behavioral disturbance: Secondary | ICD-10-CM | POA: Diagnosis not present

## 2016-11-16 DIAGNOSIS — I8390 Asymptomatic varicose veins of unspecified lower extremity: Secondary | ICD-10-CM | POA: Diagnosis not present

## 2016-11-16 DIAGNOSIS — Z6821 Body mass index (BMI) 21.0-21.9, adult: Secondary | ICD-10-CM | POA: Diagnosis not present

## 2016-11-16 DIAGNOSIS — Z299 Encounter for prophylactic measures, unspecified: Secondary | ICD-10-CM | POA: Diagnosis not present

## 2016-11-16 DIAGNOSIS — Z789 Other specified health status: Secondary | ICD-10-CM | POA: Diagnosis not present

## 2016-11-16 DIAGNOSIS — G309 Alzheimer's disease, unspecified: Secondary | ICD-10-CM | POA: Diagnosis not present

## 2016-11-16 DIAGNOSIS — J45909 Unspecified asthma, uncomplicated: Secondary | ICD-10-CM | POA: Diagnosis not present

## 2016-11-16 DIAGNOSIS — I491 Atrial premature depolarization: Secondary | ICD-10-CM | POA: Diagnosis not present

## 2016-11-16 DIAGNOSIS — I071 Rheumatic tricuspid insufficiency: Secondary | ICD-10-CM | POA: Diagnosis not present

## 2016-11-16 DIAGNOSIS — J0100 Acute maxillary sinusitis, unspecified: Secondary | ICD-10-CM | POA: Diagnosis not present

## 2016-11-16 DIAGNOSIS — I82409 Acute embolism and thrombosis of unspecified deep veins of unspecified lower extremity: Secondary | ICD-10-CM | POA: Diagnosis not present

## 2016-11-23 DIAGNOSIS — Z6821 Body mass index (BMI) 21.0-21.9, adult: Secondary | ICD-10-CM | POA: Diagnosis not present

## 2016-11-23 DIAGNOSIS — I82409 Acute embolism and thrombosis of unspecified deep veins of unspecified lower extremity: Secondary | ICD-10-CM | POA: Diagnosis not present

## 2016-11-23 DIAGNOSIS — J45909 Unspecified asthma, uncomplicated: Secondary | ICD-10-CM | POA: Diagnosis not present

## 2016-11-23 DIAGNOSIS — Z299 Encounter for prophylactic measures, unspecified: Secondary | ICD-10-CM | POA: Diagnosis not present

## 2016-11-23 DIAGNOSIS — G309 Alzheimer's disease, unspecified: Secondary | ICD-10-CM | POA: Diagnosis not present

## 2016-11-23 DIAGNOSIS — R51 Headache: Secondary | ICD-10-CM | POA: Diagnosis not present

## 2016-11-23 DIAGNOSIS — F0280 Dementia in other diseases classified elsewhere without behavioral disturbance: Secondary | ICD-10-CM | POA: Diagnosis not present

## 2016-11-23 DIAGNOSIS — I10 Essential (primary) hypertension: Secondary | ICD-10-CM | POA: Diagnosis not present

## 2016-11-23 DIAGNOSIS — Z713 Dietary counseling and surveillance: Secondary | ICD-10-CM | POA: Diagnosis not present

## 2016-12-06 DIAGNOSIS — J45909 Unspecified asthma, uncomplicated: Secondary | ICD-10-CM | POA: Diagnosis not present

## 2016-12-06 DIAGNOSIS — Z299 Encounter for prophylactic measures, unspecified: Secondary | ICD-10-CM | POA: Diagnosis not present

## 2016-12-06 DIAGNOSIS — G309 Alzheimer's disease, unspecified: Secondary | ICD-10-CM | POA: Diagnosis not present

## 2016-12-06 DIAGNOSIS — Z713 Dietary counseling and surveillance: Secondary | ICD-10-CM | POA: Diagnosis not present

## 2016-12-06 DIAGNOSIS — Z6821 Body mass index (BMI) 21.0-21.9, adult: Secondary | ICD-10-CM | POA: Diagnosis not present

## 2016-12-06 DIAGNOSIS — F0280 Dementia in other diseases classified elsewhere without behavioral disturbance: Secondary | ICD-10-CM | POA: Diagnosis not present

## 2016-12-06 DIAGNOSIS — Z789 Other specified health status: Secondary | ICD-10-CM | POA: Diagnosis not present

## 2016-12-06 DIAGNOSIS — I82409 Acute embolism and thrombosis of unspecified deep veins of unspecified lower extremity: Secondary | ICD-10-CM | POA: Diagnosis not present

## 2016-12-06 DIAGNOSIS — I10 Essential (primary) hypertension: Secondary | ICD-10-CM | POA: Diagnosis not present

## 2016-12-15 DIAGNOSIS — I10 Essential (primary) hypertension: Secondary | ICD-10-CM | POA: Diagnosis not present

## 2016-12-15 DIAGNOSIS — J449 Chronic obstructive pulmonary disease, unspecified: Secondary | ICD-10-CM | POA: Diagnosis not present

## 2017-01-24 DIAGNOSIS — Z86718 Personal history of other venous thrombosis and embolism: Secondary | ICD-10-CM | POA: Diagnosis not present

## 2017-01-24 DIAGNOSIS — Z7951 Long term (current) use of inhaled steroids: Secondary | ICD-10-CM | POA: Diagnosis not present

## 2017-01-24 DIAGNOSIS — J449 Chronic obstructive pulmonary disease, unspecified: Secondary | ICD-10-CM | POA: Diagnosis not present

## 2017-01-24 DIAGNOSIS — Z79899 Other long term (current) drug therapy: Secondary | ICD-10-CM | POA: Diagnosis not present

## 2017-01-24 DIAGNOSIS — I10 Essential (primary) hypertension: Secondary | ICD-10-CM | POA: Diagnosis not present

## 2017-01-24 DIAGNOSIS — R531 Weakness: Secondary | ICD-10-CM | POA: Diagnosis not present

## 2017-01-24 DIAGNOSIS — E876 Hypokalemia: Secondary | ICD-10-CM | POA: Diagnosis not present

## 2017-01-25 DIAGNOSIS — R5383 Other fatigue: Secondary | ICD-10-CM | POA: Diagnosis not present

## 2017-01-25 DIAGNOSIS — E7800 Pure hypercholesterolemia, unspecified: Secondary | ICD-10-CM | POA: Diagnosis not present

## 2017-01-25 DIAGNOSIS — J0100 Acute maxillary sinusitis, unspecified: Secondary | ICD-10-CM | POA: Diagnosis not present

## 2017-01-25 DIAGNOSIS — I82409 Acute embolism and thrombosis of unspecified deep veins of unspecified lower extremity: Secondary | ICD-10-CM | POA: Diagnosis not present

## 2017-01-25 DIAGNOSIS — I80209 Phlebitis and thrombophlebitis of unspecified deep vessels of unspecified lower extremity: Secondary | ICD-10-CM | POA: Diagnosis not present

## 2017-01-25 DIAGNOSIS — Z6823 Body mass index (BMI) 23.0-23.9, adult: Secondary | ICD-10-CM | POA: Diagnosis not present

## 2017-01-25 DIAGNOSIS — I10 Essential (primary) hypertension: Secondary | ICD-10-CM | POA: Diagnosis not present

## 2017-01-25 DIAGNOSIS — Z713 Dietary counseling and surveillance: Secondary | ICD-10-CM | POA: Diagnosis not present

## 2017-01-25 DIAGNOSIS — Z6821 Body mass index (BMI) 21.0-21.9, adult: Secondary | ICD-10-CM | POA: Diagnosis not present

## 2017-01-28 DIAGNOSIS — Z6823 Body mass index (BMI) 23.0-23.9, adult: Secondary | ICD-10-CM | POA: Diagnosis not present

## 2017-01-28 DIAGNOSIS — G309 Alzheimer's disease, unspecified: Secondary | ICD-10-CM | POA: Diagnosis not present

## 2017-01-28 DIAGNOSIS — J45909 Unspecified asthma, uncomplicated: Secondary | ICD-10-CM | POA: Diagnosis not present

## 2017-01-28 DIAGNOSIS — Z713 Dietary counseling and surveillance: Secondary | ICD-10-CM | POA: Diagnosis not present

## 2017-01-28 DIAGNOSIS — Z789 Other specified health status: Secondary | ICD-10-CM | POA: Diagnosis not present

## 2017-01-28 DIAGNOSIS — E7800 Pure hypercholesterolemia, unspecified: Secondary | ICD-10-CM | POA: Diagnosis not present

## 2017-01-28 DIAGNOSIS — Z299 Encounter for prophylactic measures, unspecified: Secondary | ICD-10-CM | POA: Diagnosis not present

## 2017-01-28 DIAGNOSIS — I10 Essential (primary) hypertension: Secondary | ICD-10-CM | POA: Diagnosis not present

## 2017-01-28 DIAGNOSIS — F0280 Dementia in other diseases classified elsewhere without behavioral disturbance: Secondary | ICD-10-CM | POA: Diagnosis not present

## 2017-01-31 DIAGNOSIS — E7800 Pure hypercholesterolemia, unspecified: Secondary | ICD-10-CM | POA: Diagnosis not present

## 2017-01-31 DIAGNOSIS — Z789 Other specified health status: Secondary | ICD-10-CM | POA: Diagnosis not present

## 2017-01-31 DIAGNOSIS — Z299 Encounter for prophylactic measures, unspecified: Secondary | ICD-10-CM | POA: Diagnosis not present

## 2017-01-31 DIAGNOSIS — I82409 Acute embolism and thrombosis of unspecified deep veins of unspecified lower extremity: Secondary | ICD-10-CM | POA: Diagnosis not present

## 2017-01-31 DIAGNOSIS — I10 Essential (primary) hypertension: Secondary | ICD-10-CM | POA: Diagnosis not present

## 2017-01-31 DIAGNOSIS — J069 Acute upper respiratory infection, unspecified: Secondary | ICD-10-CM | POA: Diagnosis not present

## 2017-01-31 DIAGNOSIS — Z6822 Body mass index (BMI) 22.0-22.9, adult: Secondary | ICD-10-CM | POA: Diagnosis not present

## 2017-02-01 DIAGNOSIS — Z86718 Personal history of other venous thrombosis and embolism: Secondary | ICD-10-CM | POA: Diagnosis not present

## 2017-02-01 DIAGNOSIS — I10 Essential (primary) hypertension: Secondary | ICD-10-CM | POA: Diagnosis not present

## 2017-02-01 DIAGNOSIS — F0390 Unspecified dementia without behavioral disturbance: Secondary | ICD-10-CM | POA: Diagnosis not present

## 2017-02-01 DIAGNOSIS — J449 Chronic obstructive pulmonary disease, unspecified: Secondary | ICD-10-CM | POA: Diagnosis not present

## 2017-02-01 DIAGNOSIS — R202 Paresthesia of skin: Secondary | ICD-10-CM | POA: Diagnosis not present

## 2017-02-01 DIAGNOSIS — Z79899 Other long term (current) drug therapy: Secondary | ICD-10-CM | POA: Diagnosis not present

## 2017-02-01 DIAGNOSIS — R55 Syncope and collapse: Secondary | ICD-10-CM | POA: Diagnosis not present

## 2017-02-02 DIAGNOSIS — I10 Essential (primary) hypertension: Secondary | ICD-10-CM | POA: Diagnosis not present

## 2017-02-02 DIAGNOSIS — Z299 Encounter for prophylactic measures, unspecified: Secondary | ICD-10-CM | POA: Diagnosis not present

## 2017-02-02 DIAGNOSIS — E7800 Pure hypercholesterolemia, unspecified: Secondary | ICD-10-CM | POA: Diagnosis not present

## 2017-02-02 DIAGNOSIS — F0280 Dementia in other diseases classified elsewhere without behavioral disturbance: Secondary | ICD-10-CM | POA: Diagnosis not present

## 2017-02-02 DIAGNOSIS — Z6822 Body mass index (BMI) 22.0-22.9, adult: Secondary | ICD-10-CM | POA: Diagnosis not present

## 2017-02-02 DIAGNOSIS — Z789 Other specified health status: Secondary | ICD-10-CM | POA: Diagnosis not present

## 2017-02-02 DIAGNOSIS — G309 Alzheimer's disease, unspecified: Secondary | ICD-10-CM | POA: Diagnosis not present

## 2017-02-04 DIAGNOSIS — J449 Chronic obstructive pulmonary disease, unspecified: Secondary | ICD-10-CM | POA: Diagnosis not present

## 2017-02-04 DIAGNOSIS — I10 Essential (primary) hypertension: Secondary | ICD-10-CM | POA: Diagnosis not present

## 2017-03-16 DIAGNOSIS — Z6822 Body mass index (BMI) 22.0-22.9, adult: Secondary | ICD-10-CM | POA: Diagnosis not present

## 2017-03-16 DIAGNOSIS — F0280 Dementia in other diseases classified elsewhere without behavioral disturbance: Secondary | ICD-10-CM | POA: Diagnosis not present

## 2017-03-16 DIAGNOSIS — I10 Essential (primary) hypertension: Secondary | ICD-10-CM | POA: Diagnosis not present

## 2017-03-16 DIAGNOSIS — G309 Alzheimer's disease, unspecified: Secondary | ICD-10-CM | POA: Diagnosis not present

## 2017-03-16 DIAGNOSIS — J441 Chronic obstructive pulmonary disease with (acute) exacerbation: Secondary | ICD-10-CM | POA: Diagnosis not present

## 2017-03-16 DIAGNOSIS — J0101 Acute recurrent maxillary sinusitis: Secondary | ICD-10-CM | POA: Diagnosis not present

## 2017-03-16 DIAGNOSIS — I8390 Asymptomatic varicose veins of unspecified lower extremity: Secondary | ICD-10-CM | POA: Diagnosis not present

## 2017-03-16 DIAGNOSIS — E7800 Pure hypercholesterolemia, unspecified: Secondary | ICD-10-CM | POA: Diagnosis not present

## 2017-03-16 DIAGNOSIS — I82409 Acute embolism and thrombosis of unspecified deep veins of unspecified lower extremity: Secondary | ICD-10-CM | POA: Diagnosis not present

## 2017-03-16 DIAGNOSIS — Z299 Encounter for prophylactic measures, unspecified: Secondary | ICD-10-CM | POA: Diagnosis not present

## 2017-03-16 DIAGNOSIS — J45909 Unspecified asthma, uncomplicated: Secondary | ICD-10-CM | POA: Diagnosis not present

## 2017-03-31 DIAGNOSIS — I10 Essential (primary) hypertension: Secondary | ICD-10-CM | POA: Diagnosis not present

## 2017-03-31 DIAGNOSIS — J449 Chronic obstructive pulmonary disease, unspecified: Secondary | ICD-10-CM | POA: Diagnosis not present

## 2017-04-16 DIAGNOSIS — I10 Essential (primary) hypertension: Secondary | ICD-10-CM | POA: Diagnosis not present

## 2017-04-16 DIAGNOSIS — Z7901 Long term (current) use of anticoagulants: Secondary | ICD-10-CM | POA: Diagnosis not present

## 2017-04-16 DIAGNOSIS — G4489 Other headache syndrome: Secondary | ICD-10-CM | POA: Diagnosis not present

## 2017-04-16 DIAGNOSIS — S199XXA Unspecified injury of neck, initial encounter: Secondary | ICD-10-CM | POA: Diagnosis not present

## 2017-04-16 DIAGNOSIS — S098XXA Other specified injuries of head, initial encounter: Secondary | ICD-10-CM | POA: Diagnosis not present

## 2017-04-16 DIAGNOSIS — J449 Chronic obstructive pulmonary disease, unspecified: Secondary | ICD-10-CM | POA: Diagnosis not present

## 2017-04-16 DIAGNOSIS — S0003XA Contusion of scalp, initial encounter: Secondary | ICD-10-CM | POA: Diagnosis not present

## 2017-04-16 DIAGNOSIS — S0031XA Abrasion of nose, initial encounter: Secondary | ICD-10-CM | POA: Diagnosis not present

## 2017-04-16 DIAGNOSIS — F0390 Unspecified dementia without behavioral disturbance: Secondary | ICD-10-CM | POA: Diagnosis not present

## 2017-04-16 DIAGNOSIS — E871 Hypo-osmolality and hyponatremia: Secondary | ICD-10-CM | POA: Diagnosis not present

## 2017-04-16 DIAGNOSIS — Z79899 Other long term (current) drug therapy: Secondary | ICD-10-CM | POA: Diagnosis not present

## 2017-04-16 DIAGNOSIS — Z86718 Personal history of other venous thrombosis and embolism: Secondary | ICD-10-CM | POA: Diagnosis not present

## 2017-04-16 DIAGNOSIS — S50311A Abrasion of right elbow, initial encounter: Secondary | ICD-10-CM | POA: Diagnosis not present

## 2017-04-16 DIAGNOSIS — S52501A Unspecified fracture of the lower end of right radius, initial encounter for closed fracture: Secondary | ICD-10-CM | POA: Diagnosis not present

## 2017-04-16 DIAGNOSIS — S0033XA Contusion of nose, initial encounter: Secondary | ICD-10-CM | POA: Diagnosis not present

## 2017-04-16 DIAGNOSIS — R918 Other nonspecific abnormal finding of lung field: Secondary | ICD-10-CM | POA: Diagnosis not present

## 2017-05-02 DIAGNOSIS — H6980 Other specified disorders of Eustachian tube, unspecified ear: Secondary | ICD-10-CM | POA: Diagnosis not present

## 2017-05-02 DIAGNOSIS — I10 Essential (primary) hypertension: Secondary | ICD-10-CM | POA: Diagnosis not present

## 2017-05-02 DIAGNOSIS — J069 Acute upper respiratory infection, unspecified: Secondary | ICD-10-CM | POA: Diagnosis not present

## 2017-05-02 DIAGNOSIS — Z713 Dietary counseling and surveillance: Secondary | ICD-10-CM | POA: Diagnosis not present

## 2017-05-02 DIAGNOSIS — Z299 Encounter for prophylactic measures, unspecified: Secondary | ICD-10-CM | POA: Diagnosis not present

## 2017-05-02 DIAGNOSIS — G309 Alzheimer's disease, unspecified: Secondary | ICD-10-CM | POA: Diagnosis not present

## 2017-05-02 DIAGNOSIS — R42 Dizziness and giddiness: Secondary | ICD-10-CM | POA: Diagnosis not present

## 2017-05-02 DIAGNOSIS — F0280 Dementia in other diseases classified elsewhere without behavioral disturbance: Secondary | ICD-10-CM | POA: Diagnosis not present

## 2017-05-12 DIAGNOSIS — G309 Alzheimer's disease, unspecified: Secondary | ICD-10-CM | POA: Diagnosis not present

## 2017-05-12 DIAGNOSIS — Z7901 Long term (current) use of anticoagulants: Secondary | ICD-10-CM | POA: Diagnosis not present

## 2017-05-12 DIAGNOSIS — F0390 Unspecified dementia without behavioral disturbance: Secondary | ICD-10-CM | POA: Diagnosis not present

## 2017-05-12 DIAGNOSIS — E871 Hypo-osmolality and hyponatremia: Secondary | ICD-10-CM | POA: Diagnosis not present

## 2017-05-12 DIAGNOSIS — Z7951 Long term (current) use of inhaled steroids: Secondary | ICD-10-CM | POA: Diagnosis not present

## 2017-05-12 DIAGNOSIS — Z6822 Body mass index (BMI) 22.0-22.9, adult: Secondary | ICD-10-CM | POA: Diagnosis not present

## 2017-05-12 DIAGNOSIS — Z79899 Other long term (current) drug therapy: Secondary | ICD-10-CM | POA: Diagnosis not present

## 2017-05-12 DIAGNOSIS — E860 Dehydration: Secondary | ICD-10-CM | POA: Diagnosis not present

## 2017-05-12 DIAGNOSIS — F0280 Dementia in other diseases classified elsewhere without behavioral disturbance: Secondary | ICD-10-CM | POA: Diagnosis not present

## 2017-05-12 DIAGNOSIS — Z299 Encounter for prophylactic measures, unspecified: Secondary | ICD-10-CM | POA: Diagnosis not present

## 2017-05-12 DIAGNOSIS — R531 Weakness: Secondary | ICD-10-CM | POA: Diagnosis not present

## 2017-05-12 DIAGNOSIS — R404 Transient alteration of awareness: Secondary | ICD-10-CM | POA: Diagnosis not present

## 2017-05-12 DIAGNOSIS — J449 Chronic obstructive pulmonary disease, unspecified: Secondary | ICD-10-CM | POA: Diagnosis not present

## 2017-05-12 DIAGNOSIS — R1110 Vomiting, unspecified: Secondary | ICD-10-CM | POA: Diagnosis not present

## 2017-05-12 DIAGNOSIS — I10 Essential (primary) hypertension: Secondary | ICD-10-CM | POA: Diagnosis not present

## 2017-05-12 DIAGNOSIS — R4182 Altered mental status, unspecified: Secondary | ICD-10-CM | POA: Diagnosis not present

## 2017-05-17 DIAGNOSIS — Z1339 Encounter for screening examination for other mental health and behavioral disorders: Secondary | ICD-10-CM | POA: Diagnosis not present

## 2017-05-17 DIAGNOSIS — F0280 Dementia in other diseases classified elsewhere without behavioral disturbance: Secondary | ICD-10-CM | POA: Diagnosis not present

## 2017-05-17 DIAGNOSIS — Z7189 Other specified counseling: Secondary | ICD-10-CM | POA: Diagnosis not present

## 2017-05-17 DIAGNOSIS — Z0000 Encounter for general adult medical examination without abnormal findings: Secondary | ICD-10-CM | POA: Diagnosis not present

## 2017-05-17 DIAGNOSIS — Z1331 Encounter for screening for depression: Secondary | ICD-10-CM | POA: Diagnosis not present

## 2017-05-17 DIAGNOSIS — Z6822 Body mass index (BMI) 22.0-22.9, adult: Secondary | ICD-10-CM | POA: Diagnosis not present

## 2017-05-17 DIAGNOSIS — G309 Alzheimer's disease, unspecified: Secondary | ICD-10-CM | POA: Diagnosis not present

## 2017-05-17 DIAGNOSIS — E7800 Pure hypercholesterolemia, unspecified: Secondary | ICD-10-CM | POA: Diagnosis not present

## 2017-05-17 DIAGNOSIS — Z299 Encounter for prophylactic measures, unspecified: Secondary | ICD-10-CM | POA: Diagnosis not present

## 2017-06-07 DIAGNOSIS — Z86718 Personal history of other venous thrombosis and embolism: Secondary | ICD-10-CM | POA: Diagnosis not present

## 2017-06-07 DIAGNOSIS — Z7951 Long term (current) use of inhaled steroids: Secondary | ICD-10-CM | POA: Diagnosis not present

## 2017-06-07 DIAGNOSIS — J449 Chronic obstructive pulmonary disease, unspecified: Secondary | ICD-10-CM | POA: Diagnosis not present

## 2017-06-07 DIAGNOSIS — G4489 Other headache syndrome: Secondary | ICD-10-CM | POA: Diagnosis not present

## 2017-06-07 DIAGNOSIS — R404 Transient alteration of awareness: Secondary | ICD-10-CM | POA: Diagnosis not present

## 2017-06-07 DIAGNOSIS — R51 Headache: Secondary | ICD-10-CM | POA: Diagnosis not present

## 2017-06-07 DIAGNOSIS — R42 Dizziness and giddiness: Secondary | ICD-10-CM | POA: Diagnosis not present

## 2017-06-07 DIAGNOSIS — R531 Weakness: Secondary | ICD-10-CM | POA: Diagnosis not present

## 2017-06-07 DIAGNOSIS — I10 Essential (primary) hypertension: Secondary | ICD-10-CM | POA: Diagnosis not present

## 2017-07-01 DIAGNOSIS — I6789 Other cerebrovascular disease: Secondary | ICD-10-CM | POA: Diagnosis not present

## 2017-07-01 DIAGNOSIS — Z7951 Long term (current) use of inhaled steroids: Secondary | ICD-10-CM | POA: Diagnosis not present

## 2017-07-01 DIAGNOSIS — N3000 Acute cystitis without hematuria: Secondary | ICD-10-CM | POA: Diagnosis not present

## 2017-07-01 DIAGNOSIS — R202 Paresthesia of skin: Secondary | ICD-10-CM | POA: Diagnosis not present

## 2017-07-01 DIAGNOSIS — J320 Chronic maxillary sinusitis: Secondary | ICD-10-CM | POA: Diagnosis not present

## 2017-07-01 DIAGNOSIS — I10 Essential (primary) hypertension: Secondary | ICD-10-CM | POA: Diagnosis not present

## 2017-07-01 DIAGNOSIS — Z79899 Other long term (current) drug therapy: Secondary | ICD-10-CM | POA: Diagnosis not present

## 2017-07-01 DIAGNOSIS — J449 Chronic obstructive pulmonary disease, unspecified: Secondary | ICD-10-CM | POA: Diagnosis not present

## 2017-07-01 DIAGNOSIS — J329 Chronic sinusitis, unspecified: Secondary | ICD-10-CM | POA: Diagnosis not present

## 2017-07-01 DIAGNOSIS — R51 Headache: Secondary | ICD-10-CM | POA: Diagnosis not present

## 2017-07-20 DIAGNOSIS — Z299 Encounter for prophylactic measures, unspecified: Secondary | ICD-10-CM | POA: Diagnosis not present

## 2017-07-20 DIAGNOSIS — Z6821 Body mass index (BMI) 21.0-21.9, adult: Secondary | ICD-10-CM | POA: Diagnosis not present

## 2017-07-20 DIAGNOSIS — J069 Acute upper respiratory infection, unspecified: Secondary | ICD-10-CM | POA: Diagnosis not present

## 2017-07-20 DIAGNOSIS — I82409 Acute embolism and thrombosis of unspecified deep veins of unspecified lower extremity: Secondary | ICD-10-CM | POA: Diagnosis not present

## 2017-07-20 DIAGNOSIS — I10 Essential (primary) hypertension: Secondary | ICD-10-CM | POA: Diagnosis not present

## 2017-07-20 DIAGNOSIS — Z789 Other specified health status: Secondary | ICD-10-CM | POA: Diagnosis not present

## 2017-07-23 DIAGNOSIS — R030 Elevated blood-pressure reading, without diagnosis of hypertension: Secondary | ICD-10-CM | POA: Diagnosis not present

## 2017-07-24 DIAGNOSIS — I6789 Other cerebrovascular disease: Secondary | ICD-10-CM | POA: Diagnosis not present

## 2017-07-24 DIAGNOSIS — R202 Paresthesia of skin: Secondary | ICD-10-CM | POA: Diagnosis not present

## 2017-07-25 DIAGNOSIS — Z299 Encounter for prophylactic measures, unspecified: Secondary | ICD-10-CM | POA: Diagnosis not present

## 2017-07-25 DIAGNOSIS — I10 Essential (primary) hypertension: Secondary | ICD-10-CM | POA: Diagnosis not present

## 2017-07-25 DIAGNOSIS — F0280 Dementia in other diseases classified elsewhere without behavioral disturbance: Secondary | ICD-10-CM | POA: Diagnosis not present

## 2017-07-25 DIAGNOSIS — G309 Alzheimer's disease, unspecified: Secondary | ICD-10-CM | POA: Diagnosis not present

## 2017-07-25 DIAGNOSIS — R05 Cough: Secondary | ICD-10-CM | POA: Diagnosis not present

## 2017-07-25 DIAGNOSIS — Z6820 Body mass index (BMI) 20.0-20.9, adult: Secondary | ICD-10-CM | POA: Diagnosis not present

## 2017-08-16 DIAGNOSIS — R531 Weakness: Secondary | ICD-10-CM | POA: Diagnosis not present

## 2017-08-16 DIAGNOSIS — J019 Acute sinusitis, unspecified: Secondary | ICD-10-CM | POA: Diagnosis not present

## 2017-08-16 DIAGNOSIS — E876 Hypokalemia: Secondary | ICD-10-CM | POA: Diagnosis not present

## 2017-08-16 DIAGNOSIS — J449 Chronic obstructive pulmonary disease, unspecified: Secondary | ICD-10-CM | POA: Diagnosis not present

## 2017-08-16 DIAGNOSIS — R404 Transient alteration of awareness: Secondary | ICD-10-CM | POA: Diagnosis not present

## 2017-08-16 DIAGNOSIS — I10 Essential (primary) hypertension: Secondary | ICD-10-CM | POA: Diagnosis not present

## 2017-08-16 DIAGNOSIS — R51 Headache: Secondary | ICD-10-CM | POA: Diagnosis not present

## 2017-08-16 DIAGNOSIS — R0602 Shortness of breath: Secondary | ICD-10-CM | POA: Diagnosis not present

## 2017-08-16 DIAGNOSIS — R079 Chest pain, unspecified: Secondary | ICD-10-CM | POA: Diagnosis not present

## 2017-08-16 DIAGNOSIS — Z79899 Other long term (current) drug therapy: Secondary | ICD-10-CM | POA: Diagnosis not present

## 2017-08-16 DIAGNOSIS — J189 Pneumonia, unspecified organism: Secondary | ICD-10-CM | POA: Diagnosis not present

## 2017-08-23 DIAGNOSIS — H9313 Tinnitus, bilateral: Secondary | ICD-10-CM | POA: Diagnosis not present

## 2017-08-23 DIAGNOSIS — Z86718 Personal history of other venous thrombosis and embolism: Secondary | ICD-10-CM | POA: Diagnosis not present

## 2017-08-23 DIAGNOSIS — Z9981 Dependence on supplemental oxygen: Secondary | ICD-10-CM | POA: Diagnosis not present

## 2017-08-23 DIAGNOSIS — R079 Chest pain, unspecified: Secondary | ICD-10-CM | POA: Diagnosis not present

## 2017-08-23 DIAGNOSIS — Z20828 Contact with and (suspected) exposure to other viral communicable diseases: Secondary | ICD-10-CM | POA: Diagnosis not present

## 2017-08-23 DIAGNOSIS — I10 Essential (primary) hypertension: Secondary | ICD-10-CM | POA: Diagnosis not present

## 2017-08-23 DIAGNOSIS — Z79899 Other long term (current) drug therapy: Secondary | ICD-10-CM | POA: Diagnosis not present

## 2017-08-23 DIAGNOSIS — J449 Chronic obstructive pulmonary disease, unspecified: Secondary | ICD-10-CM | POA: Diagnosis not present

## 2017-08-23 DIAGNOSIS — I4891 Unspecified atrial fibrillation: Secondary | ICD-10-CM | POA: Diagnosis not present

## 2017-08-23 DIAGNOSIS — J40 Bronchitis, not specified as acute or chronic: Secondary | ICD-10-CM | POA: Diagnosis not present

## 2017-08-23 DIAGNOSIS — H9319 Tinnitus, unspecified ear: Secondary | ICD-10-CM | POA: Diagnosis not present

## 2017-08-23 DIAGNOSIS — J019 Acute sinusitis, unspecified: Secondary | ICD-10-CM | POA: Diagnosis not present

## 2017-08-23 DIAGNOSIS — R51 Headache: Secondary | ICD-10-CM | POA: Diagnosis not present

## 2017-09-12 DIAGNOSIS — I10 Essential (primary) hypertension: Secondary | ICD-10-CM | POA: Diagnosis not present

## 2017-09-12 DIAGNOSIS — J449 Chronic obstructive pulmonary disease, unspecified: Secondary | ICD-10-CM | POA: Diagnosis not present

## 2017-09-15 DIAGNOSIS — I82409 Acute embolism and thrombosis of unspecified deep veins of unspecified lower extremity: Secondary | ICD-10-CM | POA: Diagnosis not present

## 2017-09-15 DIAGNOSIS — I10 Essential (primary) hypertension: Secondary | ICD-10-CM | POA: Diagnosis not present

## 2017-09-15 DIAGNOSIS — Z789 Other specified health status: Secondary | ICD-10-CM | POA: Diagnosis not present

## 2017-09-15 DIAGNOSIS — J019 Acute sinusitis, unspecified: Secondary | ICD-10-CM | POA: Diagnosis not present

## 2017-09-15 DIAGNOSIS — Z6820 Body mass index (BMI) 20.0-20.9, adult: Secondary | ICD-10-CM | POA: Diagnosis not present

## 2017-09-15 DIAGNOSIS — Z299 Encounter for prophylactic measures, unspecified: Secondary | ICD-10-CM | POA: Diagnosis not present

## 2017-09-15 DIAGNOSIS — G309 Alzheimer's disease, unspecified: Secondary | ICD-10-CM | POA: Diagnosis not present

## 2017-09-15 DIAGNOSIS — F0280 Dementia in other diseases classified elsewhere without behavioral disturbance: Secondary | ICD-10-CM | POA: Diagnosis not present

## 2017-09-15 DIAGNOSIS — R600 Localized edema: Secondary | ICD-10-CM | POA: Diagnosis not present

## 2017-10-21 DIAGNOSIS — I10 Essential (primary) hypertension: Secondary | ICD-10-CM | POA: Diagnosis not present

## 2017-10-21 DIAGNOSIS — J449 Chronic obstructive pulmonary disease, unspecified: Secondary | ICD-10-CM | POA: Diagnosis not present

## 2017-10-24 DIAGNOSIS — I82409 Acute embolism and thrombosis of unspecified deep veins of unspecified lower extremity: Secondary | ICD-10-CM | POA: Diagnosis not present

## 2017-10-24 DIAGNOSIS — Z6820 Body mass index (BMI) 20.0-20.9, adult: Secondary | ICD-10-CM | POA: Diagnosis not present

## 2017-10-24 DIAGNOSIS — I10 Essential (primary) hypertension: Secondary | ICD-10-CM | POA: Diagnosis not present

## 2017-10-24 DIAGNOSIS — R600 Localized edema: Secondary | ICD-10-CM | POA: Diagnosis not present

## 2017-10-24 DIAGNOSIS — J069 Acute upper respiratory infection, unspecified: Secondary | ICD-10-CM | POA: Diagnosis not present

## 2017-10-24 DIAGNOSIS — F0280 Dementia in other diseases classified elsewhere without behavioral disturbance: Secondary | ICD-10-CM | POA: Diagnosis not present

## 2017-10-24 DIAGNOSIS — G309 Alzheimer's disease, unspecified: Secondary | ICD-10-CM | POA: Diagnosis not present

## 2017-10-24 DIAGNOSIS — Z299 Encounter for prophylactic measures, unspecified: Secondary | ICD-10-CM | POA: Diagnosis not present

## 2017-11-06 DIAGNOSIS — G309 Alzheimer's disease, unspecified: Secondary | ICD-10-CM | POA: Diagnosis not present

## 2017-11-06 DIAGNOSIS — I10 Essential (primary) hypertension: Secondary | ICD-10-CM | POA: Diagnosis not present

## 2017-11-06 DIAGNOSIS — S60511A Abrasion of right hand, initial encounter: Secondary | ICD-10-CM | POA: Diagnosis not present

## 2017-11-06 DIAGNOSIS — S82025A Nondisplaced longitudinal fracture of left patella, initial encounter for closed fracture: Secondary | ICD-10-CM | POA: Diagnosis not present

## 2017-11-06 DIAGNOSIS — S99921A Unspecified injury of right foot, initial encounter: Secondary | ICD-10-CM | POA: Diagnosis not present

## 2017-11-06 DIAGNOSIS — W19XXXA Unspecified fall, initial encounter: Secondary | ICD-10-CM | POA: Diagnosis not present

## 2017-11-06 DIAGNOSIS — J449 Chronic obstructive pulmonary disease, unspecified: Secondary | ICD-10-CM | POA: Diagnosis not present

## 2017-11-06 DIAGNOSIS — S82002A Unspecified fracture of left patella, initial encounter for closed fracture: Secondary | ICD-10-CM | POA: Diagnosis not present

## 2017-11-06 DIAGNOSIS — F0281 Dementia in other diseases classified elsewhere with behavioral disturbance: Secondary | ICD-10-CM | POA: Diagnosis not present

## 2017-11-06 DIAGNOSIS — S82045A Nondisplaced comminuted fracture of left patella, initial encounter for closed fracture: Secondary | ICD-10-CM | POA: Diagnosis not present

## 2017-11-06 DIAGNOSIS — S3993XA Unspecified injury of pelvis, initial encounter: Secondary | ICD-10-CM | POA: Diagnosis not present

## 2017-11-06 DIAGNOSIS — W1839XA Other fall on same level, initial encounter: Secondary | ICD-10-CM | POA: Diagnosis not present

## 2017-11-06 DIAGNOSIS — M79672 Pain in left foot: Secondary | ICD-10-CM | POA: Diagnosis not present

## 2017-11-06 DIAGNOSIS — M7989 Other specified soft tissue disorders: Secondary | ICD-10-CM | POA: Diagnosis not present

## 2017-11-06 DIAGNOSIS — M79671 Pain in right foot: Secondary | ICD-10-CM | POA: Diagnosis not present

## 2017-11-06 DIAGNOSIS — S60512A Abrasion of left hand, initial encounter: Secondary | ICD-10-CM | POA: Diagnosis not present

## 2017-11-06 DIAGNOSIS — Y9301 Activity, walking, marching and hiking: Secondary | ICD-10-CM | POA: Diagnosis not present

## 2017-11-06 DIAGNOSIS — S199XXA Unspecified injury of neck, initial encounter: Secondary | ICD-10-CM | POA: Diagnosis not present

## 2017-11-07 DIAGNOSIS — R2681 Unsteadiness on feet: Secondary | ICD-10-CM | POA: Diagnosis not present

## 2017-11-07 DIAGNOSIS — J449 Chronic obstructive pulmonary disease, unspecified: Secondary | ICD-10-CM | POA: Diagnosis present

## 2017-11-07 DIAGNOSIS — Z9981 Dependence on supplemental oxygen: Secondary | ICD-10-CM | POA: Diagnosis not present

## 2017-11-07 DIAGNOSIS — Z86718 Personal history of other venous thrombosis and embolism: Secondary | ICD-10-CM | POA: Diagnosis not present

## 2017-11-07 DIAGNOSIS — M79671 Pain in right foot: Secondary | ICD-10-CM | POA: Diagnosis not present

## 2017-11-07 DIAGNOSIS — I10 Essential (primary) hypertension: Secondary | ICD-10-CM | POA: Diagnosis present

## 2017-11-07 DIAGNOSIS — W19XXXA Unspecified fall, initial encounter: Secondary | ICD-10-CM | POA: Diagnosis not present

## 2017-11-07 DIAGNOSIS — M791 Myalgia, unspecified site: Secondary | ICD-10-CM | POA: Diagnosis not present

## 2017-11-07 DIAGNOSIS — M7989 Other specified soft tissue disorders: Secondary | ICD-10-CM | POA: Diagnosis not present

## 2017-11-07 DIAGNOSIS — S82002S Unspecified fracture of left patella, sequela: Secondary | ICD-10-CM | POA: Diagnosis not present

## 2017-11-07 DIAGNOSIS — F0281 Dementia in other diseases classified elsewhere with behavioral disturbance: Secondary | ICD-10-CM | POA: Diagnosis not present

## 2017-11-07 DIAGNOSIS — Y9301 Activity, walking, marching and hiking: Secondary | ICD-10-CM | POA: Diagnosis not present

## 2017-11-07 DIAGNOSIS — J45909 Unspecified asthma, uncomplicated: Secondary | ICD-10-CM | POA: Diagnosis not present

## 2017-11-07 DIAGNOSIS — Z880 Allergy status to penicillin: Secondary | ICD-10-CM | POA: Diagnosis not present

## 2017-11-07 DIAGNOSIS — Z887 Allergy status to serum and vaccine status: Secondary | ICD-10-CM | POA: Diagnosis not present

## 2017-11-07 DIAGNOSIS — R451 Restlessness and agitation: Secondary | ICD-10-CM | POA: Diagnosis not present

## 2017-11-07 DIAGNOSIS — S60511A Abrasion of right hand, initial encounter: Secondary | ICD-10-CM | POA: Diagnosis present

## 2017-11-07 DIAGNOSIS — M6281 Muscle weakness (generalized): Secondary | ICD-10-CM | POA: Diagnosis not present

## 2017-11-07 DIAGNOSIS — R262 Difficulty in walking, not elsewhere classified: Secondary | ICD-10-CM | POA: Diagnosis not present

## 2017-11-07 DIAGNOSIS — S99921A Unspecified injury of right foot, initial encounter: Secondary | ICD-10-CM | POA: Diagnosis not present

## 2017-11-07 DIAGNOSIS — S82002A Unspecified fracture of left patella, initial encounter for closed fracture: Secondary | ICD-10-CM | POA: Diagnosis present

## 2017-11-07 DIAGNOSIS — Z9181 History of falling: Secondary | ICD-10-CM | POA: Diagnosis not present

## 2017-11-07 DIAGNOSIS — G309 Alzheimer's disease, unspecified: Secondary | ICD-10-CM | POA: Diagnosis present

## 2017-11-07 DIAGNOSIS — Z79899 Other long term (current) drug therapy: Secondary | ICD-10-CM | POA: Diagnosis not present

## 2017-11-07 DIAGNOSIS — M542 Cervicalgia: Secondary | ICD-10-CM | POA: Diagnosis present

## 2017-11-07 DIAGNOSIS — S60512A Abrasion of left hand, initial encounter: Secondary | ICD-10-CM | POA: Diagnosis present

## 2017-11-07 DIAGNOSIS — F0390 Unspecified dementia without behavioral disturbance: Secondary | ICD-10-CM | POA: Diagnosis not present

## 2017-11-09 DIAGNOSIS — S82002S Unspecified fracture of left patella, sequela: Secondary | ICD-10-CM | POA: Diagnosis not present

## 2017-11-09 DIAGNOSIS — J45909 Unspecified asthma, uncomplicated: Secondary | ICD-10-CM | POA: Diagnosis not present

## 2017-11-09 DIAGNOSIS — R2681 Unsteadiness on feet: Secondary | ICD-10-CM | POA: Diagnosis not present

## 2017-11-09 DIAGNOSIS — Y9301 Activity, walking, marching and hiking: Secondary | ICD-10-CM | POA: Diagnosis not present

## 2017-11-09 DIAGNOSIS — F0390 Unspecified dementia without behavioral disturbance: Secondary | ICD-10-CM | POA: Diagnosis not present

## 2017-11-09 DIAGNOSIS — Z9181 History of falling: Secondary | ICD-10-CM | POA: Diagnosis not present

## 2017-11-09 DIAGNOSIS — R451 Restlessness and agitation: Secondary | ICD-10-CM | POA: Diagnosis not present

## 2017-11-09 DIAGNOSIS — M791 Myalgia, unspecified site: Secondary | ICD-10-CM | POA: Diagnosis not present

## 2017-11-09 DIAGNOSIS — G309 Alzheimer's disease, unspecified: Secondary | ICD-10-CM | POA: Diagnosis not present

## 2017-11-09 DIAGNOSIS — M545 Low back pain: Secondary | ICD-10-CM | POA: Diagnosis not present

## 2017-11-09 DIAGNOSIS — J302 Other seasonal allergic rhinitis: Secondary | ICD-10-CM | POA: Diagnosis not present

## 2017-11-09 DIAGNOSIS — I10 Essential (primary) hypertension: Secondary | ICD-10-CM | POA: Diagnosis not present

## 2017-11-09 DIAGNOSIS — W19XXXA Unspecified fall, initial encounter: Secondary | ICD-10-CM | POA: Diagnosis not present

## 2017-11-09 DIAGNOSIS — S82002A Unspecified fracture of left patella, initial encounter for closed fracture: Secondary | ICD-10-CM | POA: Diagnosis not present

## 2017-11-09 DIAGNOSIS — F0280 Dementia in other diseases classified elsewhere without behavioral disturbance: Secondary | ICD-10-CM | POA: Diagnosis not present

## 2017-11-09 DIAGNOSIS — R079 Chest pain, unspecified: Secondary | ICD-10-CM | POA: Diagnosis not present

## 2017-11-09 DIAGNOSIS — R262 Difficulty in walking, not elsewhere classified: Secondary | ICD-10-CM | POA: Diagnosis not present

## 2017-11-09 DIAGNOSIS — F0281 Dementia in other diseases classified elsewhere with behavioral disturbance: Secondary | ICD-10-CM | POA: Diagnosis not present

## 2017-11-09 DIAGNOSIS — M6281 Muscle weakness (generalized): Secondary | ICD-10-CM | POA: Diagnosis not present

## 2017-11-10 DIAGNOSIS — I10 Essential (primary) hypertension: Secondary | ICD-10-CM | POA: Diagnosis not present

## 2017-11-10 DIAGNOSIS — R262 Difficulty in walking, not elsewhere classified: Secondary | ICD-10-CM | POA: Diagnosis not present

## 2017-11-10 DIAGNOSIS — S82002A Unspecified fracture of left patella, initial encounter for closed fracture: Secondary | ICD-10-CM | POA: Diagnosis not present

## 2017-11-10 DIAGNOSIS — G309 Alzheimer's disease, unspecified: Secondary | ICD-10-CM | POA: Diagnosis not present

## 2017-11-14 DIAGNOSIS — I10 Essential (primary) hypertension: Secondary | ICD-10-CM | POA: Diagnosis not present

## 2017-11-14 DIAGNOSIS — G309 Alzheimer's disease, unspecified: Secondary | ICD-10-CM | POA: Diagnosis not present

## 2017-11-14 DIAGNOSIS — M6281 Muscle weakness (generalized): Secondary | ICD-10-CM | POA: Diagnosis not present

## 2017-11-14 DIAGNOSIS — S82002A Unspecified fracture of left patella, initial encounter for closed fracture: Secondary | ICD-10-CM | POA: Diagnosis not present

## 2017-11-21 DIAGNOSIS — M545 Low back pain: Secondary | ICD-10-CM | POA: Diagnosis not present

## 2017-11-21 DIAGNOSIS — J302 Other seasonal allergic rhinitis: Secondary | ICD-10-CM | POA: Diagnosis not present

## 2017-11-21 DIAGNOSIS — R262 Difficulty in walking, not elsewhere classified: Secondary | ICD-10-CM | POA: Diagnosis not present

## 2017-11-21 DIAGNOSIS — S82002A Unspecified fracture of left patella, initial encounter for closed fracture: Secondary | ICD-10-CM | POA: Diagnosis not present

## 2017-11-24 DIAGNOSIS — I10 Essential (primary) hypertension: Secondary | ICD-10-CM | POA: Diagnosis not present

## 2017-11-24 DIAGNOSIS — F0280 Dementia in other diseases classified elsewhere without behavioral disturbance: Secondary | ICD-10-CM | POA: Diagnosis not present

## 2017-11-24 DIAGNOSIS — S82002A Unspecified fracture of left patella, initial encounter for closed fracture: Secondary | ICD-10-CM | POA: Diagnosis not present

## 2017-11-24 DIAGNOSIS — R262 Difficulty in walking, not elsewhere classified: Secondary | ICD-10-CM | POA: Diagnosis not present

## 2017-12-02 DIAGNOSIS — I10 Essential (primary) hypertension: Secondary | ICD-10-CM | POA: Diagnosis not present

## 2017-12-02 DIAGNOSIS — J449 Chronic obstructive pulmonary disease, unspecified: Secondary | ICD-10-CM | POA: Diagnosis not present

## 2017-12-13 DIAGNOSIS — J449 Chronic obstructive pulmonary disease, unspecified: Secondary | ICD-10-CM | POA: Diagnosis not present

## 2017-12-13 DIAGNOSIS — I10 Essential (primary) hypertension: Secondary | ICD-10-CM | POA: Diagnosis not present

## 2018-01-10 DIAGNOSIS — Z681 Body mass index (BMI) 19 or less, adult: Secondary | ICD-10-CM | POA: Diagnosis not present

## 2018-01-10 DIAGNOSIS — I10 Essential (primary) hypertension: Secondary | ICD-10-CM | POA: Diagnosis not present

## 2018-01-10 DIAGNOSIS — G309 Alzheimer's disease, unspecified: Secondary | ICD-10-CM | POA: Diagnosis not present

## 2018-01-10 DIAGNOSIS — Z299 Encounter for prophylactic measures, unspecified: Secondary | ICD-10-CM | POA: Diagnosis not present

## 2018-01-10 DIAGNOSIS — I491 Atrial premature depolarization: Secondary | ICD-10-CM | POA: Diagnosis not present

## 2018-01-10 DIAGNOSIS — F0280 Dementia in other diseases classified elsewhere without behavioral disturbance: Secondary | ICD-10-CM | POA: Diagnosis not present

## 2018-01-10 DIAGNOSIS — J069 Acute upper respiratory infection, unspecified: Secondary | ICD-10-CM | POA: Diagnosis not present

## 2018-01-18 DIAGNOSIS — J449 Chronic obstructive pulmonary disease, unspecified: Secondary | ICD-10-CM | POA: Diagnosis not present

## 2018-01-18 DIAGNOSIS — I10 Essential (primary) hypertension: Secondary | ICD-10-CM | POA: Diagnosis not present

## 2018-02-17 DIAGNOSIS — I10 Essential (primary) hypertension: Secondary | ICD-10-CM | POA: Diagnosis not present

## 2018-02-17 DIAGNOSIS — J449 Chronic obstructive pulmonary disease, unspecified: Secondary | ICD-10-CM | POA: Diagnosis not present

## 2018-03-09 DIAGNOSIS — G4489 Other headache syndrome: Secondary | ICD-10-CM | POA: Diagnosis not present

## 2018-03-10 DIAGNOSIS — G309 Alzheimer's disease, unspecified: Secondary | ICD-10-CM | POA: Diagnosis not present

## 2018-03-10 DIAGNOSIS — F0280 Dementia in other diseases classified elsewhere without behavioral disturbance: Secondary | ICD-10-CM | POA: Diagnosis not present

## 2018-03-15 DIAGNOSIS — J449 Chronic obstructive pulmonary disease, unspecified: Secondary | ICD-10-CM | POA: Diagnosis not present

## 2018-03-15 DIAGNOSIS — J45901 Unspecified asthma with (acute) exacerbation: Secondary | ICD-10-CM | POA: Diagnosis not present

## 2018-03-15 DIAGNOSIS — Z86718 Personal history of other venous thrombosis and embolism: Secondary | ICD-10-CM | POA: Diagnosis not present

## 2018-03-15 DIAGNOSIS — Z79899 Other long term (current) drug therapy: Secondary | ICD-10-CM | POA: Diagnosis not present

## 2018-03-15 DIAGNOSIS — R062 Wheezing: Secondary | ICD-10-CM | POA: Diagnosis not present

## 2018-03-15 DIAGNOSIS — R0600 Dyspnea, unspecified: Secondary | ICD-10-CM | POA: Diagnosis not present

## 2018-03-15 DIAGNOSIS — I499 Cardiac arrhythmia, unspecified: Secondary | ICD-10-CM | POA: Diagnosis not present

## 2018-03-15 DIAGNOSIS — I10 Essential (primary) hypertension: Secondary | ICD-10-CM | POA: Diagnosis not present

## 2018-03-15 DIAGNOSIS — R0602 Shortness of breath: Secondary | ICD-10-CM | POA: Diagnosis not present

## 2018-03-15 DIAGNOSIS — R0902 Hypoxemia: Secondary | ICD-10-CM | POA: Diagnosis not present

## 2018-03-15 DIAGNOSIS — R069 Unspecified abnormalities of breathing: Secondary | ICD-10-CM | POA: Diagnosis not present

## 2018-04-02 DIAGNOSIS — J449 Chronic obstructive pulmonary disease, unspecified: Secondary | ICD-10-CM | POA: Diagnosis not present

## 2018-04-02 DIAGNOSIS — R404 Transient alteration of awareness: Secondary | ICD-10-CM | POA: Diagnosis not present

## 2018-04-02 DIAGNOSIS — I10 Essential (primary) hypertension: Secondary | ICD-10-CM | POA: Diagnosis not present

## 2018-04-02 DIAGNOSIS — J209 Acute bronchitis, unspecified: Secondary | ICD-10-CM | POA: Diagnosis not present

## 2018-04-02 DIAGNOSIS — R0902 Hypoxemia: Secondary | ICD-10-CM | POA: Diagnosis not present

## 2018-04-02 DIAGNOSIS — R0602 Shortness of breath: Secondary | ICD-10-CM | POA: Diagnosis not present

## 2018-04-02 DIAGNOSIS — J4541 Moderate persistent asthma with (acute) exacerbation: Secondary | ICD-10-CM | POA: Diagnosis not present

## 2018-04-02 DIAGNOSIS — R062 Wheezing: Secondary | ICD-10-CM | POA: Diagnosis not present

## 2018-04-03 DIAGNOSIS — J441 Chronic obstructive pulmonary disease with (acute) exacerbation: Secondary | ICD-10-CM | POA: Diagnosis not present

## 2018-04-03 DIAGNOSIS — R0602 Shortness of breath: Secondary | ICD-10-CM | POA: Diagnosis not present

## 2018-04-03 DIAGNOSIS — R5381 Other malaise: Secondary | ICD-10-CM | POA: Diagnosis not present

## 2018-04-03 DIAGNOSIS — R05 Cough: Secondary | ICD-10-CM | POA: Diagnosis not present

## 2018-04-03 DIAGNOSIS — Z79899 Other long term (current) drug therapy: Secondary | ICD-10-CM | POA: Diagnosis not present

## 2018-04-03 DIAGNOSIS — Z743 Need for continuous supervision: Secondary | ICD-10-CM | POA: Diagnosis not present

## 2018-04-03 DIAGNOSIS — R062 Wheezing: Secondary | ICD-10-CM | POA: Diagnosis not present

## 2018-04-03 DIAGNOSIS — R0902 Hypoxemia: Secondary | ICD-10-CM | POA: Diagnosis not present

## 2018-05-01 DIAGNOSIS — J441 Chronic obstructive pulmonary disease with (acute) exacerbation: Secondary | ICD-10-CM | POA: Diagnosis not present

## 2018-05-01 DIAGNOSIS — R0902 Hypoxemia: Secondary | ICD-10-CM | POA: Diagnosis not present

## 2018-05-01 DIAGNOSIS — R0602 Shortness of breath: Secondary | ICD-10-CM | POA: Diagnosis not present

## 2018-05-01 DIAGNOSIS — I700 Atherosclerosis of aorta: Secondary | ICD-10-CM | POA: Diagnosis not present

## 2018-05-01 DIAGNOSIS — I10 Essential (primary) hypertension: Secondary | ICD-10-CM | POA: Diagnosis not present

## 2018-05-01 DIAGNOSIS — R0600 Dyspnea, unspecified: Secondary | ICD-10-CM | POA: Diagnosis not present

## 2018-05-01 DIAGNOSIS — R069 Unspecified abnormalities of breathing: Secondary | ICD-10-CM | POA: Diagnosis not present

## 2018-05-01 DIAGNOSIS — R062 Wheezing: Secondary | ICD-10-CM | POA: Diagnosis not present

## 2018-05-01 DIAGNOSIS — Z79899 Other long term (current) drug therapy: Secondary | ICD-10-CM | POA: Diagnosis not present

## 2018-05-02 DIAGNOSIS — R5381 Other malaise: Secondary | ICD-10-CM | POA: Diagnosis not present

## 2018-05-02 DIAGNOSIS — Z743 Need for continuous supervision: Secondary | ICD-10-CM | POA: Diagnosis not present

## 2018-05-07 DIAGNOSIS — I10 Essential (primary) hypertension: Secondary | ICD-10-CM | POA: Diagnosis not present

## 2018-05-07 DIAGNOSIS — R0602 Shortness of breath: Secondary | ICD-10-CM | POA: Diagnosis not present

## 2018-05-07 DIAGNOSIS — R079 Chest pain, unspecified: Secondary | ICD-10-CM | POA: Diagnosis not present

## 2018-05-07 DIAGNOSIS — Z79899 Other long term (current) drug therapy: Secondary | ICD-10-CM | POA: Diagnosis not present

## 2018-05-07 DIAGNOSIS — R062 Wheezing: Secondary | ICD-10-CM | POA: Diagnosis not present

## 2018-05-07 DIAGNOSIS — R069 Unspecified abnormalities of breathing: Secondary | ICD-10-CM | POA: Diagnosis not present

## 2018-05-07 DIAGNOSIS — Z86718 Personal history of other venous thrombosis and embolism: Secondary | ICD-10-CM | POA: Diagnosis not present

## 2018-05-07 DIAGNOSIS — R0789 Other chest pain: Secondary | ICD-10-CM | POA: Diagnosis not present

## 2018-05-07 DIAGNOSIS — J441 Chronic obstructive pulmonary disease with (acute) exacerbation: Secondary | ICD-10-CM | POA: Diagnosis not present

## 2018-05-23 DIAGNOSIS — Z681 Body mass index (BMI) 19 or less, adult: Secondary | ICD-10-CM | POA: Diagnosis not present

## 2018-05-23 DIAGNOSIS — Z299 Encounter for prophylactic measures, unspecified: Secondary | ICD-10-CM | POA: Diagnosis not present

## 2018-05-23 DIAGNOSIS — I10 Essential (primary) hypertension: Secondary | ICD-10-CM | POA: Diagnosis not present

## 2018-05-23 DIAGNOSIS — J069 Acute upper respiratory infection, unspecified: Secondary | ICD-10-CM | POA: Diagnosis not present

## 2018-06-30 DIAGNOSIS — J449 Chronic obstructive pulmonary disease, unspecified: Secondary | ICD-10-CM | POA: Diagnosis not present

## 2018-06-30 DIAGNOSIS — R0902 Hypoxemia: Secondary | ICD-10-CM | POA: Diagnosis not present

## 2018-06-30 DIAGNOSIS — R070 Pain in throat: Secondary | ICD-10-CM | POA: Diagnosis not present

## 2018-06-30 DIAGNOSIS — R52 Pain, unspecified: Secondary | ICD-10-CM | POA: Diagnosis not present

## 2018-06-30 DIAGNOSIS — R410 Disorientation, unspecified: Secondary | ICD-10-CM | POA: Diagnosis not present

## 2018-06-30 DIAGNOSIS — Z79899 Other long term (current) drug therapy: Secondary | ICD-10-CM | POA: Diagnosis not present

## 2018-06-30 DIAGNOSIS — I10 Essential (primary) hypertension: Secondary | ICD-10-CM | POA: Diagnosis not present

## 2018-06-30 DIAGNOSIS — R0989 Other specified symptoms and signs involving the circulatory and respiratory systems: Secondary | ICD-10-CM | POA: Diagnosis not present

## 2018-06-30 DIAGNOSIS — F0390 Unspecified dementia without behavioral disturbance: Secondary | ICD-10-CM | POA: Diagnosis not present

## 2018-07-11 DIAGNOSIS — Z87891 Personal history of nicotine dependence: Secondary | ICD-10-CM | POA: Diagnosis not present

## 2018-07-11 DIAGNOSIS — J320 Chronic maxillary sinusitis: Secondary | ICD-10-CM | POA: Diagnosis not present

## 2018-07-11 DIAGNOSIS — Z79899 Other long term (current) drug therapy: Secondary | ICD-10-CM | POA: Diagnosis not present

## 2018-07-11 DIAGNOSIS — R062 Wheezing: Secondary | ICD-10-CM | POA: Diagnosis not present

## 2018-07-11 DIAGNOSIS — Z7401 Bed confinement status: Secondary | ICD-10-CM | POA: Diagnosis not present

## 2018-07-11 DIAGNOSIS — J690 Pneumonitis due to inhalation of food and vomit: Secondary | ICD-10-CM | POA: Diagnosis not present

## 2018-07-11 DIAGNOSIS — R1312 Dysphagia, oropharyngeal phase: Secondary | ICD-10-CM | POA: Diagnosis not present

## 2018-07-11 DIAGNOSIS — Z86718 Personal history of other venous thrombosis and embolism: Secondary | ICD-10-CM | POA: Diagnosis not present

## 2018-07-11 DIAGNOSIS — E860 Dehydration: Secondary | ICD-10-CM | POA: Diagnosis present

## 2018-07-11 DIAGNOSIS — F0390 Unspecified dementia without behavioral disturbance: Secondary | ICD-10-CM | POA: Diagnosis not present

## 2018-07-11 DIAGNOSIS — M2550 Pain in unspecified joint: Secondary | ICD-10-CM | POA: Diagnosis not present

## 2018-07-11 DIAGNOSIS — F419 Anxiety disorder, unspecified: Secondary | ICD-10-CM | POA: Diagnosis not present

## 2018-07-11 DIAGNOSIS — R05 Cough: Secondary | ICD-10-CM | POA: Diagnosis not present

## 2018-07-11 DIAGNOSIS — R0902 Hypoxemia: Secondary | ICD-10-CM | POA: Diagnosis not present

## 2018-07-11 DIAGNOSIS — I10 Essential (primary) hypertension: Secondary | ICD-10-CM | POA: Diagnosis present

## 2018-07-11 DIAGNOSIS — G309 Alzheimer's disease, unspecified: Secondary | ICD-10-CM | POA: Diagnosis present

## 2018-07-11 DIAGNOSIS — G43909 Migraine, unspecified, not intractable, without status migrainosus: Secondary | ICD-10-CM | POA: Diagnosis not present

## 2018-07-11 DIAGNOSIS — J42 Unspecified chronic bronchitis: Secondary | ICD-10-CM | POA: Diagnosis not present

## 2018-07-11 DIAGNOSIS — J189 Pneumonia, unspecified organism: Secondary | ICD-10-CM | POA: Diagnosis not present

## 2018-07-11 DIAGNOSIS — R531 Weakness: Secondary | ICD-10-CM | POA: Diagnosis present

## 2018-07-11 DIAGNOSIS — E876 Hypokalemia: Secondary | ICD-10-CM | POA: Diagnosis present

## 2018-07-11 DIAGNOSIS — J440 Chronic obstructive pulmonary disease with acute lower respiratory infection: Secondary | ICD-10-CM | POA: Diagnosis present

## 2018-07-11 DIAGNOSIS — R079 Chest pain, unspecified: Secondary | ICD-10-CM | POA: Diagnosis not present

## 2018-07-11 DIAGNOSIS — M6281 Muscle weakness (generalized): Secondary | ICD-10-CM | POA: Diagnosis not present

## 2018-07-11 DIAGNOSIS — R41841 Cognitive communication deficit: Secondary | ICD-10-CM | POA: Diagnosis not present

## 2018-07-11 DIAGNOSIS — S82045S Nondisplaced comminuted fracture of left patella, sequela: Secondary | ICD-10-CM | POA: Diagnosis not present

## 2018-07-11 DIAGNOSIS — I82501 Chronic embolism and thrombosis of unspecified deep veins of right lower extremity: Secondary | ICD-10-CM | POA: Diagnosis not present

## 2018-07-11 DIAGNOSIS — Z2821 Immunization not carried out because of patient refusal: Secondary | ICD-10-CM | POA: Diagnosis not present

## 2018-07-11 DIAGNOSIS — R2681 Unsteadiness on feet: Secondary | ICD-10-CM | POA: Diagnosis not present

## 2018-07-11 DIAGNOSIS — M26601 Right temporomandibular joint disorder, unspecified: Secondary | ICD-10-CM | POA: Diagnosis not present

## 2018-07-11 DIAGNOSIS — R0602 Shortness of breath: Secondary | ICD-10-CM | POA: Diagnosis not present

## 2018-07-11 DIAGNOSIS — F0280 Dementia in other diseases classified elsewhere without behavioral disturbance: Secondary | ICD-10-CM | POA: Diagnosis present

## 2018-07-11 DIAGNOSIS — J45909 Unspecified asthma, uncomplicated: Secondary | ICD-10-CM | POA: Diagnosis not present

## 2018-07-11 DIAGNOSIS — I499 Cardiac arrhythmia, unspecified: Secondary | ICD-10-CM | POA: Diagnosis not present

## 2018-07-11 DIAGNOSIS — J188 Other pneumonia, unspecified organism: Secondary | ICD-10-CM | POA: Diagnosis not present

## 2018-07-11 DIAGNOSIS — Z9981 Dependence on supplemental oxygen: Secondary | ICD-10-CM | POA: Diagnosis not present

## 2018-07-11 DIAGNOSIS — J45901 Unspecified asthma with (acute) exacerbation: Secondary | ICD-10-CM | POA: Diagnosis not present

## 2018-07-11 DIAGNOSIS — J449 Chronic obstructive pulmonary disease, unspecified: Secondary | ICD-10-CM | POA: Diagnosis not present

## 2018-07-11 DIAGNOSIS — R0600 Dyspnea, unspecified: Secondary | ICD-10-CM | POA: Diagnosis not present

## 2018-07-11 DIAGNOSIS — R262 Difficulty in walking, not elsewhere classified: Secondary | ICD-10-CM | POA: Diagnosis not present

## 2018-07-17 DIAGNOSIS — J45909 Unspecified asthma, uncomplicated: Secondary | ICD-10-CM | POA: Diagnosis not present

## 2018-07-17 DIAGNOSIS — J159 Unspecified bacterial pneumonia: Secondary | ICD-10-CM | POA: Diagnosis not present

## 2018-07-17 DIAGNOSIS — W19XXXA Unspecified fall, initial encounter: Secondary | ICD-10-CM | POA: Diagnosis not present

## 2018-07-17 DIAGNOSIS — F411 Generalized anxiety disorder: Secondary | ICD-10-CM | POA: Diagnosis not present

## 2018-07-17 DIAGNOSIS — I82501 Chronic embolism and thrombosis of unspecified deep veins of right lower extremity: Secondary | ICD-10-CM | POA: Diagnosis not present

## 2018-07-17 DIAGNOSIS — R062 Wheezing: Secondary | ICD-10-CM | POA: Diagnosis not present

## 2018-07-17 DIAGNOSIS — F0391 Unspecified dementia with behavioral disturbance: Secondary | ICD-10-CM | POA: Diagnosis not present

## 2018-07-17 DIAGNOSIS — J9601 Acute respiratory failure with hypoxia: Secondary | ICD-10-CM | POA: Diagnosis not present

## 2018-07-17 DIAGNOSIS — J189 Pneumonia, unspecified organism: Secondary | ICD-10-CM | POA: Diagnosis not present

## 2018-07-17 DIAGNOSIS — M26601 Right temporomandibular joint disorder, unspecified: Secondary | ICD-10-CM | POA: Diagnosis not present

## 2018-07-17 DIAGNOSIS — R52 Pain, unspecified: Secondary | ICD-10-CM | POA: Diagnosis not present

## 2018-07-17 DIAGNOSIS — R05 Cough: Secondary | ICD-10-CM | POA: Diagnosis not present

## 2018-07-17 DIAGNOSIS — I499 Cardiac arrhythmia, unspecified: Secondary | ICD-10-CM | POA: Diagnosis not present

## 2018-07-17 DIAGNOSIS — Z87891 Personal history of nicotine dependence: Secondary | ICD-10-CM | POA: Diagnosis not present

## 2018-07-17 DIAGNOSIS — R0600 Dyspnea, unspecified: Secondary | ICD-10-CM | POA: Diagnosis not present

## 2018-07-17 DIAGNOSIS — Z7401 Bed confinement status: Secondary | ICD-10-CM | POA: Diagnosis not present

## 2018-07-17 DIAGNOSIS — R262 Difficulty in walking, not elsewhere classified: Secondary | ICD-10-CM | POA: Diagnosis not present

## 2018-07-17 DIAGNOSIS — F0281 Dementia in other diseases classified elsewhere with behavioral disturbance: Secondary | ICD-10-CM | POA: Diagnosis present

## 2018-07-17 DIAGNOSIS — J188 Other pneumonia, unspecified organism: Secondary | ICD-10-CM | POA: Diagnosis not present

## 2018-07-17 DIAGNOSIS — G301 Alzheimer's disease with late onset: Secondary | ICD-10-CM | POA: Diagnosis not present

## 2018-07-17 DIAGNOSIS — J9602 Acute respiratory failure with hypercapnia: Secondary | ICD-10-CM | POA: Diagnosis present

## 2018-07-17 DIAGNOSIS — R531 Weakness: Secondary | ICD-10-CM | POA: Diagnosis not present

## 2018-07-17 DIAGNOSIS — F0390 Unspecified dementia without behavioral disturbance: Secondary | ICD-10-CM | POA: Diagnosis not present

## 2018-07-17 DIAGNOSIS — F0280 Dementia in other diseases classified elsewhere without behavioral disturbance: Secondary | ICD-10-CM | POA: Diagnosis not present

## 2018-07-17 DIAGNOSIS — M2550 Pain in unspecified joint: Secondary | ICD-10-CM | POA: Diagnosis not present

## 2018-07-17 DIAGNOSIS — G309 Alzheimer's disease, unspecified: Secondary | ICD-10-CM | POA: Diagnosis present

## 2018-07-17 DIAGNOSIS — J449 Chronic obstructive pulmonary disease, unspecified: Secondary | ICD-10-CM | POA: Diagnosis not present

## 2018-07-17 DIAGNOSIS — S0093XA Contusion of unspecified part of head, initial encounter: Secondary | ICD-10-CM | POA: Diagnosis not present

## 2018-07-17 DIAGNOSIS — Z79899 Other long term (current) drug therapy: Secondary | ICD-10-CM | POA: Diagnosis not present

## 2018-07-17 DIAGNOSIS — S1980XA Other specified injuries of unspecified part of neck, initial encounter: Secondary | ICD-10-CM | POA: Diagnosis not present

## 2018-07-17 DIAGNOSIS — I10 Essential (primary) hypertension: Secondary | ICD-10-CM | POA: Diagnosis present

## 2018-07-17 DIAGNOSIS — J441 Chronic obstructive pulmonary disease with (acute) exacerbation: Secondary | ICD-10-CM | POA: Diagnosis present

## 2018-07-17 DIAGNOSIS — R404 Transient alteration of awareness: Secondary | ICD-10-CM | POA: Diagnosis not present

## 2018-07-17 DIAGNOSIS — Z880 Allergy status to penicillin: Secondary | ICD-10-CM | POA: Diagnosis not present

## 2018-07-17 DIAGNOSIS — R609 Edema, unspecified: Secondary | ICD-10-CM | POA: Diagnosis not present

## 2018-07-17 DIAGNOSIS — R2681 Unsteadiness on feet: Secondary | ICD-10-CM | POA: Diagnosis not present

## 2018-07-17 DIAGNOSIS — M6281 Muscle weakness (generalized): Secondary | ICD-10-CM | POA: Diagnosis not present

## 2018-07-17 DIAGNOSIS — R634 Abnormal weight loss: Secondary | ICD-10-CM | POA: Diagnosis not present

## 2018-07-17 DIAGNOSIS — R41841 Cognitive communication deficit: Secondary | ICD-10-CM | POA: Diagnosis not present

## 2018-07-17 DIAGNOSIS — S0083XA Contusion of other part of head, initial encounter: Secondary | ICD-10-CM | POA: Diagnosis present

## 2018-07-17 DIAGNOSIS — J42 Unspecified chronic bronchitis: Secondary | ICD-10-CM | POA: Diagnosis not present

## 2018-07-17 DIAGNOSIS — J440 Chronic obstructive pulmonary disease with acute lower respiratory infection: Secondary | ICD-10-CM | POA: Diagnosis not present

## 2018-07-17 DIAGNOSIS — R0602 Shortness of breath: Secondary | ICD-10-CM | POA: Diagnosis not present

## 2018-07-17 DIAGNOSIS — Z887 Allergy status to serum and vaccine status: Secondary | ICD-10-CM | POA: Diagnosis not present

## 2018-07-17 DIAGNOSIS — R0902 Hypoxemia: Secondary | ICD-10-CM | POA: Diagnosis not present

## 2018-07-17 DIAGNOSIS — R1312 Dysphagia, oropharyngeal phase: Secondary | ICD-10-CM | POA: Diagnosis not present

## 2018-07-17 DIAGNOSIS — S82045S Nondisplaced comminuted fracture of left patella, sequela: Secondary | ICD-10-CM | POA: Diagnosis not present

## 2018-07-17 DIAGNOSIS — Z9981 Dependence on supplemental oxygen: Secondary | ICD-10-CM | POA: Diagnosis not present

## 2018-07-17 DIAGNOSIS — G43909 Migraine, unspecified, not intractable, without status migrainosus: Secondary | ICD-10-CM | POA: Diagnosis not present

## 2018-07-17 DIAGNOSIS — J320 Chronic maxillary sinusitis: Secondary | ICD-10-CM | POA: Diagnosis not present

## 2018-07-17 DIAGNOSIS — F419 Anxiety disorder, unspecified: Secondary | ICD-10-CM | POA: Diagnosis present

## 2018-07-18 DIAGNOSIS — R531 Weakness: Secondary | ICD-10-CM | POA: Diagnosis not present

## 2018-07-18 DIAGNOSIS — J9601 Acute respiratory failure with hypoxia: Secondary | ICD-10-CM | POA: Diagnosis not present

## 2018-07-18 DIAGNOSIS — I10 Essential (primary) hypertension: Secondary | ICD-10-CM | POA: Diagnosis not present

## 2018-07-18 DIAGNOSIS — J159 Unspecified bacterial pneumonia: Secondary | ICD-10-CM | POA: Diagnosis not present

## 2018-07-24 DIAGNOSIS — G309 Alzheimer's disease, unspecified: Secondary | ICD-10-CM | POA: Diagnosis not present

## 2018-07-24 DIAGNOSIS — F0280 Dementia in other diseases classified elsewhere without behavioral disturbance: Secondary | ICD-10-CM | POA: Diagnosis not present

## 2018-07-24 DIAGNOSIS — J159 Unspecified bacterial pneumonia: Secondary | ICD-10-CM | POA: Diagnosis not present

## 2018-07-24 DIAGNOSIS — J9601 Acute respiratory failure with hypoxia: Secondary | ICD-10-CM | POA: Diagnosis not present

## 2018-07-28 DIAGNOSIS — F419 Anxiety disorder, unspecified: Secondary | ICD-10-CM | POA: Diagnosis not present

## 2018-07-28 DIAGNOSIS — S0083XA Contusion of other part of head, initial encounter: Secondary | ICD-10-CM | POA: Diagnosis not present

## 2018-07-28 DIAGNOSIS — F0281 Dementia in other diseases classified elsewhere with behavioral disturbance: Secondary | ICD-10-CM | POA: Diagnosis not present

## 2018-07-28 DIAGNOSIS — S1980XA Other specified injuries of unspecified part of neck, initial encounter: Secondary | ICD-10-CM | POA: Diagnosis not present

## 2018-07-28 DIAGNOSIS — G309 Alzheimer's disease, unspecified: Secondary | ICD-10-CM | POA: Diagnosis not present

## 2018-07-28 DIAGNOSIS — J441 Chronic obstructive pulmonary disease with (acute) exacerbation: Secondary | ICD-10-CM | POA: Diagnosis not present

## 2018-07-28 DIAGNOSIS — S0093XA Contusion of unspecified part of head, initial encounter: Secondary | ICD-10-CM | POA: Diagnosis not present

## 2018-07-28 DIAGNOSIS — J9602 Acute respiratory failure with hypercapnia: Secondary | ICD-10-CM | POA: Diagnosis not present

## 2018-07-29 DIAGNOSIS — I10 Essential (primary) hypertension: Secondary | ICD-10-CM | POA: Diagnosis present

## 2018-07-29 DIAGNOSIS — F419 Anxiety disorder, unspecified: Secondary | ICD-10-CM | POA: Diagnosis present

## 2018-07-29 DIAGNOSIS — J441 Chronic obstructive pulmonary disease with (acute) exacerbation: Secondary | ICD-10-CM | POA: Diagnosis present

## 2018-07-29 DIAGNOSIS — F0281 Dementia in other diseases classified elsewhere with behavioral disturbance: Secondary | ICD-10-CM | POA: Diagnosis present

## 2018-07-29 DIAGNOSIS — Z880 Allergy status to penicillin: Secondary | ICD-10-CM | POA: Diagnosis not present

## 2018-07-29 DIAGNOSIS — S0083XA Contusion of other part of head, initial encounter: Secondary | ICD-10-CM | POA: Diagnosis present

## 2018-07-29 DIAGNOSIS — Z79899 Other long term (current) drug therapy: Secondary | ICD-10-CM | POA: Diagnosis not present

## 2018-07-29 DIAGNOSIS — Z887 Allergy status to serum and vaccine status: Secondary | ICD-10-CM | POA: Diagnosis not present

## 2018-07-29 DIAGNOSIS — G309 Alzheimer's disease, unspecified: Secondary | ICD-10-CM | POA: Diagnosis present

## 2018-07-29 DIAGNOSIS — J9602 Acute respiratory failure with hypercapnia: Secondary | ICD-10-CM | POA: Diagnosis not present

## 2018-07-31 DIAGNOSIS — I10 Essential (primary) hypertension: Secondary | ICD-10-CM | POA: Diagnosis not present

## 2018-07-31 DIAGNOSIS — J9601 Acute respiratory failure with hypoxia: Secondary | ICD-10-CM | POA: Diagnosis not present

## 2018-07-31 DIAGNOSIS — J159 Unspecified bacterial pneumonia: Secondary | ICD-10-CM | POA: Diagnosis not present

## 2018-07-31 DIAGNOSIS — R531 Weakness: Secondary | ICD-10-CM | POA: Diagnosis not present

## 2018-08-07 DIAGNOSIS — J449 Chronic obstructive pulmonary disease, unspecified: Secondary | ICD-10-CM | POA: Diagnosis not present

## 2018-08-10 DIAGNOSIS — J45909 Unspecified asthma, uncomplicated: Secondary | ICD-10-CM | POA: Diagnosis not present

## 2018-08-10 DIAGNOSIS — F0390 Unspecified dementia without behavioral disturbance: Secondary | ICD-10-CM | POA: Diagnosis not present

## 2018-08-10 DIAGNOSIS — J449 Chronic obstructive pulmonary disease, unspecified: Secondary | ICD-10-CM | POA: Diagnosis not present

## 2018-08-10 DIAGNOSIS — I10 Essential (primary) hypertension: Secondary | ICD-10-CM | POA: Diagnosis not present

## 2018-08-11 DIAGNOSIS — J449 Chronic obstructive pulmonary disease, unspecified: Secondary | ICD-10-CM | POA: Diagnosis not present

## 2018-08-11 DIAGNOSIS — R531 Weakness: Secondary | ICD-10-CM | POA: Diagnosis not present

## 2018-08-11 DIAGNOSIS — F0280 Dementia in other diseases classified elsewhere without behavioral disturbance: Secondary | ICD-10-CM | POA: Diagnosis not present

## 2018-08-11 DIAGNOSIS — R634 Abnormal weight loss: Secondary | ICD-10-CM | POA: Diagnosis not present

## 2018-08-28 DIAGNOSIS — I10 Essential (primary) hypertension: Secondary | ICD-10-CM | POA: Diagnosis not present

## 2018-08-28 DIAGNOSIS — J449 Chronic obstructive pulmonary disease, unspecified: Secondary | ICD-10-CM | POA: Diagnosis not present

## 2018-08-28 DIAGNOSIS — J45909 Unspecified asthma, uncomplicated: Secondary | ICD-10-CM | POA: Diagnosis not present

## 2018-08-28 DIAGNOSIS — R609 Edema, unspecified: Secondary | ICD-10-CM | POA: Diagnosis not present

## 2018-09-06 DIAGNOSIS — G301 Alzheimer's disease with late onset: Secondary | ICD-10-CM | POA: Diagnosis not present

## 2018-09-06 DIAGNOSIS — I10 Essential (primary) hypertension: Secondary | ICD-10-CM | POA: Diagnosis not present

## 2018-09-06 DIAGNOSIS — F411 Generalized anxiety disorder: Secondary | ICD-10-CM | POA: Diagnosis not present

## 2018-09-06 DIAGNOSIS — F0280 Dementia in other diseases classified elsewhere without behavioral disturbance: Secondary | ICD-10-CM | POA: Diagnosis not present

## 2018-09-06 DIAGNOSIS — F0391 Unspecified dementia with behavioral disturbance: Secondary | ICD-10-CM | POA: Diagnosis not present

## 2018-09-06 DIAGNOSIS — M6281 Muscle weakness (generalized): Secondary | ICD-10-CM | POA: Diagnosis not present

## 2018-09-29 DIAGNOSIS — R609 Edema, unspecified: Secondary | ICD-10-CM | POA: Diagnosis not present

## 2018-10-06 DIAGNOSIS — R0602 Shortness of breath: Secondary | ICD-10-CM | POA: Diagnosis not present

## 2018-10-06 DIAGNOSIS — M6281 Muscle weakness (generalized): Secondary | ICD-10-CM | POA: Diagnosis not present

## 2018-10-06 DIAGNOSIS — R062 Wheezing: Secondary | ICD-10-CM | POA: Diagnosis not present

## 2018-10-06 DIAGNOSIS — J441 Chronic obstructive pulmonary disease with (acute) exacerbation: Secondary | ICD-10-CM | POA: Diagnosis not present

## 2018-10-06 DIAGNOSIS — R05 Cough: Secondary | ICD-10-CM | POA: Diagnosis not present

## 2018-10-07 DIAGNOSIS — D649 Anemia, unspecified: Secondary | ICD-10-CM | POA: Diagnosis not present

## 2018-10-07 DIAGNOSIS — I10 Essential (primary) hypertension: Secondary | ICD-10-CM | POA: Diagnosis not present

## 2018-10-09 DIAGNOSIS — R911 Solitary pulmonary nodule: Secondary | ICD-10-CM | POA: Diagnosis not present

## 2018-10-09 DIAGNOSIS — J449 Chronic obstructive pulmonary disease, unspecified: Secondary | ICD-10-CM | POA: Diagnosis not present

## 2018-10-10 DIAGNOSIS — R531 Weakness: Secondary | ICD-10-CM | POA: Diagnosis not present

## 2018-10-10 DIAGNOSIS — I10 Essential (primary) hypertension: Secondary | ICD-10-CM | POA: Diagnosis not present

## 2018-10-10 DIAGNOSIS — J449 Chronic obstructive pulmonary disease, unspecified: Secondary | ICD-10-CM | POA: Diagnosis not present

## 2018-10-10 DIAGNOSIS — S82002A Unspecified fracture of left patella, initial encounter for closed fracture: Secondary | ICD-10-CM | POA: Diagnosis not present

## 2018-10-12 DIAGNOSIS — R531 Weakness: Secondary | ICD-10-CM | POA: Diagnosis not present

## 2018-10-12 DIAGNOSIS — G309 Alzheimer's disease, unspecified: Secondary | ICD-10-CM | POA: Diagnosis not present

## 2018-10-12 DIAGNOSIS — R278 Other lack of coordination: Secondary | ICD-10-CM | POA: Diagnosis not present

## 2018-10-12 DIAGNOSIS — F0280 Dementia in other diseases classified elsewhere without behavioral disturbance: Secondary | ICD-10-CM | POA: Diagnosis not present

## 2018-10-12 DIAGNOSIS — J449 Chronic obstructive pulmonary disease, unspecified: Secondary | ICD-10-CM | POA: Diagnosis not present

## 2018-10-13 DIAGNOSIS — F0280 Dementia in other diseases classified elsewhere without behavioral disturbance: Secondary | ICD-10-CM | POA: Diagnosis not present

## 2018-10-13 DIAGNOSIS — R531 Weakness: Secondary | ICD-10-CM | POA: Diagnosis not present

## 2018-10-13 DIAGNOSIS — R278 Other lack of coordination: Secondary | ICD-10-CM | POA: Diagnosis not present

## 2018-10-13 DIAGNOSIS — G309 Alzheimer's disease, unspecified: Secondary | ICD-10-CM | POA: Diagnosis not present

## 2018-10-13 DIAGNOSIS — J449 Chronic obstructive pulmonary disease, unspecified: Secondary | ICD-10-CM | POA: Diagnosis not present

## 2018-10-16 DIAGNOSIS — R531 Weakness: Secondary | ICD-10-CM | POA: Diagnosis not present

## 2018-10-16 DIAGNOSIS — F0280 Dementia in other diseases classified elsewhere without behavioral disturbance: Secondary | ICD-10-CM | POA: Diagnosis not present

## 2018-10-16 DIAGNOSIS — J449 Chronic obstructive pulmonary disease, unspecified: Secondary | ICD-10-CM | POA: Diagnosis not present

## 2018-10-16 DIAGNOSIS — R278 Other lack of coordination: Secondary | ICD-10-CM | POA: Diagnosis not present

## 2018-10-16 DIAGNOSIS — G309 Alzheimer's disease, unspecified: Secondary | ICD-10-CM | POA: Diagnosis not present

## 2018-10-17 DIAGNOSIS — G309 Alzheimer's disease, unspecified: Secondary | ICD-10-CM | POA: Diagnosis not present

## 2018-10-17 DIAGNOSIS — F0280 Dementia in other diseases classified elsewhere without behavioral disturbance: Secondary | ICD-10-CM | POA: Diagnosis not present

## 2018-10-17 DIAGNOSIS — R278 Other lack of coordination: Secondary | ICD-10-CM | POA: Diagnosis not present

## 2018-10-17 DIAGNOSIS — J449 Chronic obstructive pulmonary disease, unspecified: Secondary | ICD-10-CM | POA: Diagnosis not present

## 2018-10-17 DIAGNOSIS — R531 Weakness: Secondary | ICD-10-CM | POA: Diagnosis not present

## 2018-10-18 DIAGNOSIS — F0280 Dementia in other diseases classified elsewhere without behavioral disturbance: Secondary | ICD-10-CM | POA: Diagnosis not present

## 2018-10-18 DIAGNOSIS — R531 Weakness: Secondary | ICD-10-CM | POA: Diagnosis not present

## 2018-10-18 DIAGNOSIS — G309 Alzheimer's disease, unspecified: Secondary | ICD-10-CM | POA: Diagnosis not present

## 2018-10-18 DIAGNOSIS — R278 Other lack of coordination: Secondary | ICD-10-CM | POA: Diagnosis not present

## 2018-10-18 DIAGNOSIS — J449 Chronic obstructive pulmonary disease, unspecified: Secondary | ICD-10-CM | POA: Diagnosis not present

## 2018-10-19 DIAGNOSIS — R531 Weakness: Secondary | ICD-10-CM | POA: Diagnosis not present

## 2018-10-19 DIAGNOSIS — R278 Other lack of coordination: Secondary | ICD-10-CM | POA: Diagnosis not present

## 2018-10-19 DIAGNOSIS — G309 Alzheimer's disease, unspecified: Secondary | ICD-10-CM | POA: Diagnosis not present

## 2018-10-19 DIAGNOSIS — J449 Chronic obstructive pulmonary disease, unspecified: Secondary | ICD-10-CM | POA: Diagnosis not present

## 2018-10-19 DIAGNOSIS — F0280 Dementia in other diseases classified elsewhere without behavioral disturbance: Secondary | ICD-10-CM | POA: Diagnosis not present

## 2018-10-20 DIAGNOSIS — R531 Weakness: Secondary | ICD-10-CM | POA: Diagnosis not present

## 2018-10-20 DIAGNOSIS — F0280 Dementia in other diseases classified elsewhere without behavioral disturbance: Secondary | ICD-10-CM | POA: Diagnosis not present

## 2018-10-20 DIAGNOSIS — G309 Alzheimer's disease, unspecified: Secondary | ICD-10-CM | POA: Diagnosis not present

## 2018-10-20 DIAGNOSIS — R278 Other lack of coordination: Secondary | ICD-10-CM | POA: Diagnosis not present

## 2018-10-20 DIAGNOSIS — J449 Chronic obstructive pulmonary disease, unspecified: Secondary | ICD-10-CM | POA: Diagnosis not present

## 2018-10-23 DIAGNOSIS — R278 Other lack of coordination: Secondary | ICD-10-CM | POA: Diagnosis not present

## 2018-10-23 DIAGNOSIS — J449 Chronic obstructive pulmonary disease, unspecified: Secondary | ICD-10-CM | POA: Diagnosis not present

## 2018-10-23 DIAGNOSIS — G309 Alzheimer's disease, unspecified: Secondary | ICD-10-CM | POA: Diagnosis not present

## 2018-10-23 DIAGNOSIS — R531 Weakness: Secondary | ICD-10-CM | POA: Diagnosis not present

## 2018-10-23 DIAGNOSIS — F0280 Dementia in other diseases classified elsewhere without behavioral disturbance: Secondary | ICD-10-CM | POA: Diagnosis not present

## 2018-10-24 DIAGNOSIS — G309 Alzheimer's disease, unspecified: Secondary | ICD-10-CM | POA: Diagnosis not present

## 2018-10-24 DIAGNOSIS — R278 Other lack of coordination: Secondary | ICD-10-CM | POA: Diagnosis not present

## 2018-10-24 DIAGNOSIS — R531 Weakness: Secondary | ICD-10-CM | POA: Diagnosis not present

## 2018-10-24 DIAGNOSIS — F0280 Dementia in other diseases classified elsewhere without behavioral disturbance: Secondary | ICD-10-CM | POA: Diagnosis not present

## 2018-10-24 DIAGNOSIS — J449 Chronic obstructive pulmonary disease, unspecified: Secondary | ICD-10-CM | POA: Diagnosis not present

## 2018-10-25 DIAGNOSIS — G309 Alzheimer's disease, unspecified: Secondary | ICD-10-CM | POA: Diagnosis not present

## 2018-10-25 DIAGNOSIS — J449 Chronic obstructive pulmonary disease, unspecified: Secondary | ICD-10-CM | POA: Diagnosis not present

## 2018-10-25 DIAGNOSIS — F0280 Dementia in other diseases classified elsewhere without behavioral disturbance: Secondary | ICD-10-CM | POA: Diagnosis not present

## 2018-10-25 DIAGNOSIS — R531 Weakness: Secondary | ICD-10-CM | POA: Diagnosis not present

## 2018-10-25 DIAGNOSIS — R278 Other lack of coordination: Secondary | ICD-10-CM | POA: Diagnosis not present

## 2018-10-26 DIAGNOSIS — J449 Chronic obstructive pulmonary disease, unspecified: Secondary | ICD-10-CM | POA: Diagnosis not present

## 2018-10-26 DIAGNOSIS — R531 Weakness: Secondary | ICD-10-CM | POA: Diagnosis not present

## 2018-10-26 DIAGNOSIS — R278 Other lack of coordination: Secondary | ICD-10-CM | POA: Diagnosis not present

## 2018-10-26 DIAGNOSIS — G309 Alzheimer's disease, unspecified: Secondary | ICD-10-CM | POA: Diagnosis not present

## 2018-10-26 DIAGNOSIS — F0280 Dementia in other diseases classified elsewhere without behavioral disturbance: Secondary | ICD-10-CM | POA: Diagnosis not present

## 2018-10-27 DIAGNOSIS — R278 Other lack of coordination: Secondary | ICD-10-CM | POA: Diagnosis not present

## 2018-10-27 DIAGNOSIS — R531 Weakness: Secondary | ICD-10-CM | POA: Diagnosis not present

## 2018-10-27 DIAGNOSIS — F0280 Dementia in other diseases classified elsewhere without behavioral disturbance: Secondary | ICD-10-CM | POA: Diagnosis not present

## 2018-10-27 DIAGNOSIS — G309 Alzheimer's disease, unspecified: Secondary | ICD-10-CM | POA: Diagnosis not present

## 2018-10-27 DIAGNOSIS — J449 Chronic obstructive pulmonary disease, unspecified: Secondary | ICD-10-CM | POA: Diagnosis not present

## 2018-10-30 DIAGNOSIS — J449 Chronic obstructive pulmonary disease, unspecified: Secondary | ICD-10-CM | POA: Diagnosis not present

## 2018-10-30 DIAGNOSIS — G309 Alzheimer's disease, unspecified: Secondary | ICD-10-CM | POA: Diagnosis not present

## 2018-10-30 DIAGNOSIS — R531 Weakness: Secondary | ICD-10-CM | POA: Diagnosis not present

## 2018-10-30 DIAGNOSIS — F0280 Dementia in other diseases classified elsewhere without behavioral disturbance: Secondary | ICD-10-CM | POA: Diagnosis not present

## 2018-10-30 DIAGNOSIS — R278 Other lack of coordination: Secondary | ICD-10-CM | POA: Diagnosis not present

## 2018-10-31 DIAGNOSIS — G309 Alzheimer's disease, unspecified: Secondary | ICD-10-CM | POA: Diagnosis not present

## 2018-10-31 DIAGNOSIS — R278 Other lack of coordination: Secondary | ICD-10-CM | POA: Diagnosis not present

## 2018-10-31 DIAGNOSIS — R531 Weakness: Secondary | ICD-10-CM | POA: Diagnosis not present

## 2018-10-31 DIAGNOSIS — J449 Chronic obstructive pulmonary disease, unspecified: Secondary | ICD-10-CM | POA: Diagnosis not present

## 2018-10-31 DIAGNOSIS — F0280 Dementia in other diseases classified elsewhere without behavioral disturbance: Secondary | ICD-10-CM | POA: Diagnosis not present

## 2018-11-01 DIAGNOSIS — J449 Chronic obstructive pulmonary disease, unspecified: Secondary | ICD-10-CM | POA: Diagnosis not present

## 2018-11-01 DIAGNOSIS — F0280 Dementia in other diseases classified elsewhere without behavioral disturbance: Secondary | ICD-10-CM | POA: Diagnosis not present

## 2018-11-01 DIAGNOSIS — R278 Other lack of coordination: Secondary | ICD-10-CM | POA: Diagnosis not present

## 2018-11-01 DIAGNOSIS — G309 Alzheimer's disease, unspecified: Secondary | ICD-10-CM | POA: Diagnosis not present

## 2018-11-01 DIAGNOSIS — R531 Weakness: Secondary | ICD-10-CM | POA: Diagnosis not present

## 2018-11-02 DIAGNOSIS — J449 Chronic obstructive pulmonary disease, unspecified: Secondary | ICD-10-CM | POA: Diagnosis not present

## 2018-11-02 DIAGNOSIS — R278 Other lack of coordination: Secondary | ICD-10-CM | POA: Diagnosis not present

## 2018-11-02 DIAGNOSIS — G309 Alzheimer's disease, unspecified: Secondary | ICD-10-CM | POA: Diagnosis not present

## 2018-11-02 DIAGNOSIS — R531 Weakness: Secondary | ICD-10-CM | POA: Diagnosis not present

## 2018-11-02 DIAGNOSIS — F0280 Dementia in other diseases classified elsewhere without behavioral disturbance: Secondary | ICD-10-CM | POA: Diagnosis not present

## 2018-11-03 DIAGNOSIS — R262 Difficulty in walking, not elsewhere classified: Secondary | ICD-10-CM | POA: Diagnosis not present

## 2018-11-03 DIAGNOSIS — G309 Alzheimer's disease, unspecified: Secondary | ICD-10-CM | POA: Diagnosis not present

## 2018-11-03 DIAGNOSIS — J449 Chronic obstructive pulmonary disease, unspecified: Secondary | ICD-10-CM | POA: Diagnosis not present

## 2018-11-03 DIAGNOSIS — S82045S Nondisplaced comminuted fracture of left patella, sequela: Secondary | ICD-10-CM | POA: Diagnosis not present

## 2018-11-03 DIAGNOSIS — S82034S Nondisplaced transverse fracture of right patella, sequela: Secondary | ICD-10-CM | POA: Diagnosis not present

## 2018-11-03 DIAGNOSIS — I10 Essential (primary) hypertension: Secondary | ICD-10-CM | POA: Diagnosis not present

## 2018-11-03 DIAGNOSIS — M6281 Muscle weakness (generalized): Secondary | ICD-10-CM | POA: Diagnosis not present

## 2018-11-03 DIAGNOSIS — R278 Other lack of coordination: Secondary | ICD-10-CM | POA: Diagnosis not present

## 2018-11-03 DIAGNOSIS — F0280 Dementia in other diseases classified elsewhere without behavioral disturbance: Secondary | ICD-10-CM | POA: Diagnosis not present

## 2018-11-03 DIAGNOSIS — M25551 Pain in right hip: Secondary | ICD-10-CM | POA: Diagnosis not present

## 2018-11-03 DIAGNOSIS — R2681 Unsteadiness on feet: Secondary | ICD-10-CM | POA: Diagnosis not present

## 2018-11-03 DIAGNOSIS — R531 Weakness: Secondary | ICD-10-CM | POA: Diagnosis not present

## 2018-11-03 DIAGNOSIS — M25561 Pain in right knee: Secondary | ICD-10-CM | POA: Diagnosis not present

## 2018-11-06 DIAGNOSIS — J449 Chronic obstructive pulmonary disease, unspecified: Secondary | ICD-10-CM | POA: Diagnosis not present

## 2018-11-06 DIAGNOSIS — F0280 Dementia in other diseases classified elsewhere without behavioral disturbance: Secondary | ICD-10-CM | POA: Diagnosis not present

## 2018-11-06 DIAGNOSIS — I10 Essential (primary) hypertension: Secondary | ICD-10-CM | POA: Diagnosis not present

## 2018-11-06 DIAGNOSIS — G309 Alzheimer's disease, unspecified: Secondary | ICD-10-CM | POA: Diagnosis not present

## 2018-11-06 DIAGNOSIS — R2681 Unsteadiness on feet: Secondary | ICD-10-CM | POA: Diagnosis not present

## 2018-11-06 DIAGNOSIS — M25551 Pain in right hip: Secondary | ICD-10-CM | POA: Diagnosis not present

## 2018-11-07 DIAGNOSIS — J449 Chronic obstructive pulmonary disease, unspecified: Secondary | ICD-10-CM | POA: Diagnosis not present

## 2018-11-07 DIAGNOSIS — F0280 Dementia in other diseases classified elsewhere without behavioral disturbance: Secondary | ICD-10-CM | POA: Diagnosis not present

## 2018-11-07 DIAGNOSIS — G309 Alzheimer's disease, unspecified: Secondary | ICD-10-CM | POA: Diagnosis not present

## 2018-11-07 DIAGNOSIS — R2681 Unsteadiness on feet: Secondary | ICD-10-CM | POA: Diagnosis not present

## 2018-11-07 DIAGNOSIS — M25551 Pain in right hip: Secondary | ICD-10-CM | POA: Diagnosis not present

## 2018-11-07 DIAGNOSIS — I10 Essential (primary) hypertension: Secondary | ICD-10-CM | POA: Diagnosis not present

## 2018-11-08 DIAGNOSIS — M25551 Pain in right hip: Secondary | ICD-10-CM | POA: Diagnosis not present

## 2018-11-08 DIAGNOSIS — J449 Chronic obstructive pulmonary disease, unspecified: Secondary | ICD-10-CM | POA: Diagnosis not present

## 2018-11-08 DIAGNOSIS — F0280 Dementia in other diseases classified elsewhere without behavioral disturbance: Secondary | ICD-10-CM | POA: Diagnosis not present

## 2018-11-08 DIAGNOSIS — R2681 Unsteadiness on feet: Secondary | ICD-10-CM | POA: Diagnosis not present

## 2018-11-08 DIAGNOSIS — G309 Alzheimer's disease, unspecified: Secondary | ICD-10-CM | POA: Diagnosis not present

## 2018-11-08 DIAGNOSIS — I10 Essential (primary) hypertension: Secondary | ICD-10-CM | POA: Diagnosis not present

## 2018-11-09 DIAGNOSIS — M25551 Pain in right hip: Secondary | ICD-10-CM | POA: Diagnosis not present

## 2018-11-09 DIAGNOSIS — G309 Alzheimer's disease, unspecified: Secondary | ICD-10-CM | POA: Diagnosis not present

## 2018-11-09 DIAGNOSIS — R2681 Unsteadiness on feet: Secondary | ICD-10-CM | POA: Diagnosis not present

## 2018-11-09 DIAGNOSIS — I10 Essential (primary) hypertension: Secondary | ICD-10-CM | POA: Diagnosis not present

## 2018-11-09 DIAGNOSIS — J449 Chronic obstructive pulmonary disease, unspecified: Secondary | ICD-10-CM | POA: Diagnosis not present

## 2018-11-09 DIAGNOSIS — F0280 Dementia in other diseases classified elsewhere without behavioral disturbance: Secondary | ICD-10-CM | POA: Diagnosis not present

## 2018-11-10 DIAGNOSIS — J449 Chronic obstructive pulmonary disease, unspecified: Secondary | ICD-10-CM | POA: Diagnosis not present

## 2018-11-10 DIAGNOSIS — I10 Essential (primary) hypertension: Secondary | ICD-10-CM | POA: Diagnosis not present

## 2018-11-10 DIAGNOSIS — M25551 Pain in right hip: Secondary | ICD-10-CM | POA: Diagnosis not present

## 2018-11-10 DIAGNOSIS — M79632 Pain in left forearm: Secondary | ICD-10-CM | POA: Diagnosis not present

## 2018-11-10 DIAGNOSIS — S51812A Laceration without foreign body of left forearm, initial encounter: Secondary | ICD-10-CM | POA: Diagnosis not present

## 2018-11-10 DIAGNOSIS — G309 Alzheimer's disease, unspecified: Secondary | ICD-10-CM | POA: Diagnosis not present

## 2018-11-10 DIAGNOSIS — R2681 Unsteadiness on feet: Secondary | ICD-10-CM | POA: Diagnosis not present

## 2018-11-10 DIAGNOSIS — F0280 Dementia in other diseases classified elsewhere without behavioral disturbance: Secondary | ICD-10-CM | POA: Diagnosis not present

## 2018-11-10 DIAGNOSIS — M25532 Pain in left wrist: Secondary | ICD-10-CM | POA: Diagnosis not present

## 2018-11-10 DIAGNOSIS — W19XXXA Unspecified fall, initial encounter: Secondary | ICD-10-CM | POA: Diagnosis not present

## 2018-11-13 DIAGNOSIS — M25551 Pain in right hip: Secondary | ICD-10-CM | POA: Diagnosis not present

## 2018-11-13 DIAGNOSIS — M1711 Unilateral primary osteoarthritis, right knee: Secondary | ICD-10-CM | POA: Diagnosis not present

## 2018-11-13 DIAGNOSIS — I10 Essential (primary) hypertension: Secondary | ICD-10-CM | POA: Diagnosis not present

## 2018-11-13 DIAGNOSIS — R2681 Unsteadiness on feet: Secondary | ICD-10-CM | POA: Diagnosis not present

## 2018-11-13 DIAGNOSIS — R262 Difficulty in walking, not elsewhere classified: Secondary | ICD-10-CM | POA: Diagnosis not present

## 2018-11-13 DIAGNOSIS — G309 Alzheimer's disease, unspecified: Secondary | ICD-10-CM | POA: Diagnosis not present

## 2018-11-13 DIAGNOSIS — M25532 Pain in left wrist: Secondary | ICD-10-CM | POA: Diagnosis not present

## 2018-11-13 DIAGNOSIS — M25561 Pain in right knee: Secondary | ICD-10-CM | POA: Diagnosis not present

## 2018-11-13 DIAGNOSIS — F0280 Dementia in other diseases classified elsewhere without behavioral disturbance: Secondary | ICD-10-CM | POA: Diagnosis not present

## 2018-11-13 DIAGNOSIS — J449 Chronic obstructive pulmonary disease, unspecified: Secondary | ICD-10-CM | POA: Diagnosis not present

## 2018-11-13 DIAGNOSIS — M85861 Other specified disorders of bone density and structure, right lower leg: Secondary | ICD-10-CM | POA: Diagnosis not present

## 2018-11-13 DIAGNOSIS — M25461 Effusion, right knee: Secondary | ICD-10-CM | POA: Diagnosis not present

## 2018-11-14 DIAGNOSIS — M25561 Pain in right knee: Secondary | ICD-10-CM | POA: Diagnosis not present

## 2018-11-14 DIAGNOSIS — J449 Chronic obstructive pulmonary disease, unspecified: Secondary | ICD-10-CM | POA: Diagnosis not present

## 2018-11-14 DIAGNOSIS — R2681 Unsteadiness on feet: Secondary | ICD-10-CM | POA: Diagnosis not present

## 2018-11-14 DIAGNOSIS — G309 Alzheimer's disease, unspecified: Secondary | ICD-10-CM | POA: Diagnosis not present

## 2018-11-14 DIAGNOSIS — F0280 Dementia in other diseases classified elsewhere without behavioral disturbance: Secondary | ICD-10-CM | POA: Diagnosis not present

## 2018-11-14 DIAGNOSIS — I10 Essential (primary) hypertension: Secondary | ICD-10-CM | POA: Diagnosis not present

## 2018-11-14 DIAGNOSIS — M25551 Pain in right hip: Secondary | ICD-10-CM | POA: Diagnosis not present

## 2018-11-14 DIAGNOSIS — S82001A Unspecified fracture of right patella, initial encounter for closed fracture: Secondary | ICD-10-CM | POA: Diagnosis not present

## 2018-11-14 DIAGNOSIS — R262 Difficulty in walking, not elsewhere classified: Secondary | ICD-10-CM | POA: Diagnosis not present

## 2018-11-14 DIAGNOSIS — R531 Weakness: Secondary | ICD-10-CM | POA: Diagnosis not present

## 2018-11-15 DIAGNOSIS — R2681 Unsteadiness on feet: Secondary | ICD-10-CM | POA: Diagnosis not present

## 2018-11-15 DIAGNOSIS — J449 Chronic obstructive pulmonary disease, unspecified: Secondary | ICD-10-CM | POA: Diagnosis not present

## 2018-11-15 DIAGNOSIS — M25551 Pain in right hip: Secondary | ICD-10-CM | POA: Diagnosis not present

## 2018-11-15 DIAGNOSIS — G309 Alzheimer's disease, unspecified: Secondary | ICD-10-CM | POA: Diagnosis not present

## 2018-11-15 DIAGNOSIS — F0280 Dementia in other diseases classified elsewhere without behavioral disturbance: Secondary | ICD-10-CM | POA: Diagnosis not present

## 2018-11-15 DIAGNOSIS — I10 Essential (primary) hypertension: Secondary | ICD-10-CM | POA: Diagnosis not present

## 2018-11-16 DIAGNOSIS — F0280 Dementia in other diseases classified elsewhere without behavioral disturbance: Secondary | ICD-10-CM | POA: Diagnosis not present

## 2018-11-16 DIAGNOSIS — I10 Essential (primary) hypertension: Secondary | ICD-10-CM | POA: Diagnosis not present

## 2018-11-16 DIAGNOSIS — M25551 Pain in right hip: Secondary | ICD-10-CM | POA: Diagnosis not present

## 2018-11-16 DIAGNOSIS — J449 Chronic obstructive pulmonary disease, unspecified: Secondary | ICD-10-CM | POA: Diagnosis not present

## 2018-11-16 DIAGNOSIS — R2681 Unsteadiness on feet: Secondary | ICD-10-CM | POA: Diagnosis not present

## 2018-11-16 DIAGNOSIS — G309 Alzheimer's disease, unspecified: Secondary | ICD-10-CM | POA: Diagnosis not present

## 2018-11-17 DIAGNOSIS — F0280 Dementia in other diseases classified elsewhere without behavioral disturbance: Secondary | ICD-10-CM | POA: Diagnosis not present

## 2018-11-17 DIAGNOSIS — R2681 Unsteadiness on feet: Secondary | ICD-10-CM | POA: Diagnosis not present

## 2018-11-17 DIAGNOSIS — J449 Chronic obstructive pulmonary disease, unspecified: Secondary | ICD-10-CM | POA: Diagnosis not present

## 2018-11-17 DIAGNOSIS — I10 Essential (primary) hypertension: Secondary | ICD-10-CM | POA: Diagnosis not present

## 2018-11-17 DIAGNOSIS — M25551 Pain in right hip: Secondary | ICD-10-CM | POA: Diagnosis not present

## 2018-11-17 DIAGNOSIS — G309 Alzheimer's disease, unspecified: Secondary | ICD-10-CM | POA: Diagnosis not present

## 2018-11-20 DIAGNOSIS — G309 Alzheimer's disease, unspecified: Secondary | ICD-10-CM | POA: Diagnosis not present

## 2018-11-20 DIAGNOSIS — M25551 Pain in right hip: Secondary | ICD-10-CM | POA: Diagnosis not present

## 2018-11-20 DIAGNOSIS — F0280 Dementia in other diseases classified elsewhere without behavioral disturbance: Secondary | ICD-10-CM | POA: Diagnosis not present

## 2018-11-20 DIAGNOSIS — I10 Essential (primary) hypertension: Secondary | ICD-10-CM | POA: Diagnosis not present

## 2018-11-20 DIAGNOSIS — J449 Chronic obstructive pulmonary disease, unspecified: Secondary | ICD-10-CM | POA: Diagnosis not present

## 2018-11-20 DIAGNOSIS — R2681 Unsteadiness on feet: Secondary | ICD-10-CM | POA: Diagnosis not present

## 2018-11-21 DIAGNOSIS — J449 Chronic obstructive pulmonary disease, unspecified: Secondary | ICD-10-CM | POA: Diagnosis not present

## 2018-11-21 DIAGNOSIS — G309 Alzheimer's disease, unspecified: Secondary | ICD-10-CM | POA: Diagnosis not present

## 2018-11-21 DIAGNOSIS — R2681 Unsteadiness on feet: Secondary | ICD-10-CM | POA: Diagnosis not present

## 2018-11-21 DIAGNOSIS — M25551 Pain in right hip: Secondary | ICD-10-CM | POA: Diagnosis not present

## 2018-11-21 DIAGNOSIS — I10 Essential (primary) hypertension: Secondary | ICD-10-CM | POA: Diagnosis not present

## 2018-11-21 DIAGNOSIS — F0280 Dementia in other diseases classified elsewhere without behavioral disturbance: Secondary | ICD-10-CM | POA: Diagnosis not present

## 2018-11-22 DIAGNOSIS — F0280 Dementia in other diseases classified elsewhere without behavioral disturbance: Secondary | ICD-10-CM | POA: Diagnosis not present

## 2018-11-22 DIAGNOSIS — G309 Alzheimer's disease, unspecified: Secondary | ICD-10-CM | POA: Diagnosis not present

## 2018-11-22 DIAGNOSIS — R2681 Unsteadiness on feet: Secondary | ICD-10-CM | POA: Diagnosis not present

## 2018-11-22 DIAGNOSIS — I10 Essential (primary) hypertension: Secondary | ICD-10-CM | POA: Diagnosis not present

## 2018-11-22 DIAGNOSIS — J449 Chronic obstructive pulmonary disease, unspecified: Secondary | ICD-10-CM | POA: Diagnosis not present

## 2018-11-22 DIAGNOSIS — M25551 Pain in right hip: Secondary | ICD-10-CM | POA: Diagnosis not present

## 2018-11-23 DIAGNOSIS — G309 Alzheimer's disease, unspecified: Secondary | ICD-10-CM | POA: Diagnosis not present

## 2018-11-23 DIAGNOSIS — J449 Chronic obstructive pulmonary disease, unspecified: Secondary | ICD-10-CM | POA: Diagnosis not present

## 2018-11-23 DIAGNOSIS — M25551 Pain in right hip: Secondary | ICD-10-CM | POA: Diagnosis not present

## 2018-11-23 DIAGNOSIS — I10 Essential (primary) hypertension: Secondary | ICD-10-CM | POA: Diagnosis not present

## 2018-11-23 DIAGNOSIS — R2681 Unsteadiness on feet: Secondary | ICD-10-CM | POA: Diagnosis not present

## 2018-11-23 DIAGNOSIS — S82001A Unspecified fracture of right patella, initial encounter for closed fracture: Secondary | ICD-10-CM | POA: Diagnosis not present

## 2018-11-23 DIAGNOSIS — F0280 Dementia in other diseases classified elsewhere without behavioral disturbance: Secondary | ICD-10-CM | POA: Diagnosis not present

## 2018-11-24 DIAGNOSIS — M25551 Pain in right hip: Secondary | ICD-10-CM | POA: Diagnosis not present

## 2018-11-24 DIAGNOSIS — F0280 Dementia in other diseases classified elsewhere without behavioral disturbance: Secondary | ICD-10-CM | POA: Diagnosis not present

## 2018-11-24 DIAGNOSIS — G309 Alzheimer's disease, unspecified: Secondary | ICD-10-CM | POA: Diagnosis not present

## 2018-11-24 DIAGNOSIS — J449 Chronic obstructive pulmonary disease, unspecified: Secondary | ICD-10-CM | POA: Diagnosis not present

## 2018-11-24 DIAGNOSIS — R2681 Unsteadiness on feet: Secondary | ICD-10-CM | POA: Diagnosis not present

## 2018-11-24 DIAGNOSIS — I10 Essential (primary) hypertension: Secondary | ICD-10-CM | POA: Diagnosis not present

## 2018-11-27 DIAGNOSIS — F0280 Dementia in other diseases classified elsewhere without behavioral disturbance: Secondary | ICD-10-CM | POA: Diagnosis not present

## 2018-11-27 DIAGNOSIS — J449 Chronic obstructive pulmonary disease, unspecified: Secondary | ICD-10-CM | POA: Diagnosis not present

## 2018-11-27 DIAGNOSIS — M25551 Pain in right hip: Secondary | ICD-10-CM | POA: Diagnosis not present

## 2018-11-27 DIAGNOSIS — R2681 Unsteadiness on feet: Secondary | ICD-10-CM | POA: Diagnosis not present

## 2018-11-27 DIAGNOSIS — I10 Essential (primary) hypertension: Secondary | ICD-10-CM | POA: Diagnosis not present

## 2018-11-27 DIAGNOSIS — G309 Alzheimer's disease, unspecified: Secondary | ICD-10-CM | POA: Diagnosis not present

## 2018-11-28 DIAGNOSIS — J449 Chronic obstructive pulmonary disease, unspecified: Secondary | ICD-10-CM | POA: Diagnosis not present

## 2018-11-28 DIAGNOSIS — G309 Alzheimer's disease, unspecified: Secondary | ICD-10-CM | POA: Diagnosis not present

## 2018-11-28 DIAGNOSIS — I10 Essential (primary) hypertension: Secondary | ICD-10-CM | POA: Diagnosis not present

## 2018-11-28 DIAGNOSIS — F0280 Dementia in other diseases classified elsewhere without behavioral disturbance: Secondary | ICD-10-CM | POA: Diagnosis not present

## 2018-11-28 DIAGNOSIS — M25551 Pain in right hip: Secondary | ICD-10-CM | POA: Diagnosis not present

## 2018-11-28 DIAGNOSIS — R2681 Unsteadiness on feet: Secondary | ICD-10-CM | POA: Diagnosis not present

## 2018-11-29 DIAGNOSIS — R2681 Unsteadiness on feet: Secondary | ICD-10-CM | POA: Diagnosis not present

## 2018-11-29 DIAGNOSIS — M25551 Pain in right hip: Secondary | ICD-10-CM | POA: Diagnosis not present

## 2018-11-29 DIAGNOSIS — G309 Alzheimer's disease, unspecified: Secondary | ICD-10-CM | POA: Diagnosis not present

## 2018-11-29 DIAGNOSIS — F0280 Dementia in other diseases classified elsewhere without behavioral disturbance: Secondary | ICD-10-CM | POA: Diagnosis not present

## 2018-11-29 DIAGNOSIS — J449 Chronic obstructive pulmonary disease, unspecified: Secondary | ICD-10-CM | POA: Diagnosis not present

## 2018-11-29 DIAGNOSIS — I10 Essential (primary) hypertension: Secondary | ICD-10-CM | POA: Diagnosis not present

## 2018-11-30 DIAGNOSIS — J449 Chronic obstructive pulmonary disease, unspecified: Secondary | ICD-10-CM | POA: Diagnosis not present

## 2018-11-30 DIAGNOSIS — M25551 Pain in right hip: Secondary | ICD-10-CM | POA: Diagnosis not present

## 2018-11-30 DIAGNOSIS — R2681 Unsteadiness on feet: Secondary | ICD-10-CM | POA: Diagnosis not present

## 2018-11-30 DIAGNOSIS — F0280 Dementia in other diseases classified elsewhere without behavioral disturbance: Secondary | ICD-10-CM | POA: Diagnosis not present

## 2018-11-30 DIAGNOSIS — G309 Alzheimer's disease, unspecified: Secondary | ICD-10-CM | POA: Diagnosis not present

## 2018-11-30 DIAGNOSIS — I10 Essential (primary) hypertension: Secondary | ICD-10-CM | POA: Diagnosis not present

## 2018-12-01 DIAGNOSIS — R2681 Unsteadiness on feet: Secondary | ICD-10-CM | POA: Diagnosis not present

## 2018-12-01 DIAGNOSIS — I10 Essential (primary) hypertension: Secondary | ICD-10-CM | POA: Diagnosis not present

## 2018-12-01 DIAGNOSIS — M25551 Pain in right hip: Secondary | ICD-10-CM | POA: Diagnosis not present

## 2018-12-01 DIAGNOSIS — J449 Chronic obstructive pulmonary disease, unspecified: Secondary | ICD-10-CM | POA: Diagnosis not present

## 2018-12-01 DIAGNOSIS — G309 Alzheimer's disease, unspecified: Secondary | ICD-10-CM | POA: Diagnosis not present

## 2018-12-01 DIAGNOSIS — F0280 Dementia in other diseases classified elsewhere without behavioral disturbance: Secondary | ICD-10-CM | POA: Diagnosis not present

## 2018-12-04 DIAGNOSIS — R278 Other lack of coordination: Secondary | ICD-10-CM | POA: Diagnosis not present

## 2018-12-04 DIAGNOSIS — G309 Alzheimer's disease, unspecified: Secondary | ICD-10-CM | POA: Diagnosis not present

## 2018-12-04 DIAGNOSIS — I10 Essential (primary) hypertension: Secondary | ICD-10-CM | POA: Diagnosis not present

## 2018-12-04 DIAGNOSIS — R262 Difficulty in walking, not elsewhere classified: Secondary | ICD-10-CM | POA: Diagnosis not present

## 2018-12-04 DIAGNOSIS — R531 Weakness: Secondary | ICD-10-CM | POA: Diagnosis not present

## 2018-12-04 DIAGNOSIS — F0280 Dementia in other diseases classified elsewhere without behavioral disturbance: Secondary | ICD-10-CM | POA: Diagnosis not present

## 2018-12-04 DIAGNOSIS — S82034A Nondisplaced transverse fracture of right patella, initial encounter for closed fracture: Secondary | ICD-10-CM | POA: Diagnosis not present

## 2018-12-04 DIAGNOSIS — M25551 Pain in right hip: Secondary | ICD-10-CM | POA: Diagnosis not present

## 2018-12-04 DIAGNOSIS — M25561 Pain in right knee: Secondary | ICD-10-CM | POA: Diagnosis not present

## 2018-12-04 DIAGNOSIS — J449 Chronic obstructive pulmonary disease, unspecified: Secondary | ICD-10-CM | POA: Diagnosis not present

## 2018-12-04 DIAGNOSIS — S82034S Nondisplaced transverse fracture of right patella, sequela: Secondary | ICD-10-CM | POA: Diagnosis not present

## 2018-12-04 DIAGNOSIS — R2681 Unsteadiness on feet: Secondary | ICD-10-CM | POA: Diagnosis not present

## 2018-12-04 DIAGNOSIS — M6281 Muscle weakness (generalized): Secondary | ICD-10-CM | POA: Diagnosis not present

## 2018-12-05 DIAGNOSIS — J449 Chronic obstructive pulmonary disease, unspecified: Secondary | ICD-10-CM | POA: Diagnosis not present

## 2018-12-05 DIAGNOSIS — I10 Essential (primary) hypertension: Secondary | ICD-10-CM | POA: Diagnosis not present

## 2018-12-05 DIAGNOSIS — G309 Alzheimer's disease, unspecified: Secondary | ICD-10-CM | POA: Diagnosis not present

## 2018-12-05 DIAGNOSIS — F0280 Dementia in other diseases classified elsewhere without behavioral disturbance: Secondary | ICD-10-CM | POA: Diagnosis not present

## 2018-12-05 DIAGNOSIS — R2681 Unsteadiness on feet: Secondary | ICD-10-CM | POA: Diagnosis not present

## 2018-12-05 DIAGNOSIS — M25551 Pain in right hip: Secondary | ICD-10-CM | POA: Diagnosis not present

## 2018-12-06 DIAGNOSIS — M25551 Pain in right hip: Secondary | ICD-10-CM | POA: Diagnosis not present

## 2018-12-06 DIAGNOSIS — R2681 Unsteadiness on feet: Secondary | ICD-10-CM | POA: Diagnosis not present

## 2018-12-06 DIAGNOSIS — F0280 Dementia in other diseases classified elsewhere without behavioral disturbance: Secondary | ICD-10-CM | POA: Diagnosis not present

## 2018-12-06 DIAGNOSIS — J449 Chronic obstructive pulmonary disease, unspecified: Secondary | ICD-10-CM | POA: Diagnosis not present

## 2018-12-06 DIAGNOSIS — G309 Alzheimer's disease, unspecified: Secondary | ICD-10-CM | POA: Diagnosis not present

## 2018-12-06 DIAGNOSIS — I10 Essential (primary) hypertension: Secondary | ICD-10-CM | POA: Diagnosis not present

## 2018-12-07 DIAGNOSIS — F0280 Dementia in other diseases classified elsewhere without behavioral disturbance: Secondary | ICD-10-CM | POA: Diagnosis not present

## 2018-12-07 DIAGNOSIS — I10 Essential (primary) hypertension: Secondary | ICD-10-CM | POA: Diagnosis not present

## 2018-12-07 DIAGNOSIS — G309 Alzheimer's disease, unspecified: Secondary | ICD-10-CM | POA: Diagnosis not present

## 2018-12-07 DIAGNOSIS — M25551 Pain in right hip: Secondary | ICD-10-CM | POA: Diagnosis not present

## 2018-12-07 DIAGNOSIS — R2681 Unsteadiness on feet: Secondary | ICD-10-CM | POA: Diagnosis not present

## 2018-12-07 DIAGNOSIS — J449 Chronic obstructive pulmonary disease, unspecified: Secondary | ICD-10-CM | POA: Diagnosis not present

## 2018-12-08 DIAGNOSIS — G309 Alzheimer's disease, unspecified: Secondary | ICD-10-CM | POA: Diagnosis not present

## 2018-12-08 DIAGNOSIS — R2681 Unsteadiness on feet: Secondary | ICD-10-CM | POA: Diagnosis not present

## 2018-12-08 DIAGNOSIS — J302 Other seasonal allergic rhinitis: Secondary | ICD-10-CM | POA: Diagnosis not present

## 2018-12-08 DIAGNOSIS — J449 Chronic obstructive pulmonary disease, unspecified: Secondary | ICD-10-CM | POA: Diagnosis not present

## 2018-12-08 DIAGNOSIS — F0280 Dementia in other diseases classified elsewhere without behavioral disturbance: Secondary | ICD-10-CM | POA: Diagnosis not present

## 2018-12-08 DIAGNOSIS — M25551 Pain in right hip: Secondary | ICD-10-CM | POA: Diagnosis not present

## 2018-12-08 DIAGNOSIS — I10 Essential (primary) hypertension: Secondary | ICD-10-CM | POA: Diagnosis not present

## 2018-12-11 DIAGNOSIS — F0280 Dementia in other diseases classified elsewhere without behavioral disturbance: Secondary | ICD-10-CM | POA: Diagnosis not present

## 2018-12-11 DIAGNOSIS — I10 Essential (primary) hypertension: Secondary | ICD-10-CM | POA: Diagnosis not present

## 2018-12-11 DIAGNOSIS — R2681 Unsteadiness on feet: Secondary | ICD-10-CM | POA: Diagnosis not present

## 2018-12-11 DIAGNOSIS — M25551 Pain in right hip: Secondary | ICD-10-CM | POA: Diagnosis not present

## 2018-12-11 DIAGNOSIS — J449 Chronic obstructive pulmonary disease, unspecified: Secondary | ICD-10-CM | POA: Diagnosis not present

## 2018-12-11 DIAGNOSIS — G309 Alzheimer's disease, unspecified: Secondary | ICD-10-CM | POA: Diagnosis not present

## 2018-12-12 DIAGNOSIS — R2681 Unsteadiness on feet: Secondary | ICD-10-CM | POA: Diagnosis not present

## 2018-12-12 DIAGNOSIS — J449 Chronic obstructive pulmonary disease, unspecified: Secondary | ICD-10-CM | POA: Diagnosis not present

## 2018-12-12 DIAGNOSIS — G309 Alzheimer's disease, unspecified: Secondary | ICD-10-CM | POA: Diagnosis not present

## 2018-12-12 DIAGNOSIS — M25551 Pain in right hip: Secondary | ICD-10-CM | POA: Diagnosis not present

## 2018-12-12 DIAGNOSIS — I10 Essential (primary) hypertension: Secondary | ICD-10-CM | POA: Diagnosis not present

## 2018-12-12 DIAGNOSIS — F0280 Dementia in other diseases classified elsewhere without behavioral disturbance: Secondary | ICD-10-CM | POA: Diagnosis not present

## 2018-12-13 DIAGNOSIS — G309 Alzheimer's disease, unspecified: Secondary | ICD-10-CM | POA: Diagnosis not present

## 2018-12-13 DIAGNOSIS — J449 Chronic obstructive pulmonary disease, unspecified: Secondary | ICD-10-CM | POA: Diagnosis not present

## 2018-12-13 DIAGNOSIS — R2681 Unsteadiness on feet: Secondary | ICD-10-CM | POA: Diagnosis not present

## 2018-12-13 DIAGNOSIS — F0280 Dementia in other diseases classified elsewhere without behavioral disturbance: Secondary | ICD-10-CM | POA: Diagnosis not present

## 2018-12-13 DIAGNOSIS — I10 Essential (primary) hypertension: Secondary | ICD-10-CM | POA: Diagnosis not present

## 2018-12-13 DIAGNOSIS — M25551 Pain in right hip: Secondary | ICD-10-CM | POA: Diagnosis not present

## 2018-12-14 DIAGNOSIS — F0280 Dementia in other diseases classified elsewhere without behavioral disturbance: Secondary | ICD-10-CM | POA: Diagnosis not present

## 2018-12-14 DIAGNOSIS — R2681 Unsteadiness on feet: Secondary | ICD-10-CM | POA: Diagnosis not present

## 2018-12-14 DIAGNOSIS — J449 Chronic obstructive pulmonary disease, unspecified: Secondary | ICD-10-CM | POA: Diagnosis not present

## 2018-12-14 DIAGNOSIS — M25551 Pain in right hip: Secondary | ICD-10-CM | POA: Diagnosis not present

## 2018-12-14 DIAGNOSIS — I10 Essential (primary) hypertension: Secondary | ICD-10-CM | POA: Diagnosis not present

## 2018-12-14 DIAGNOSIS — G309 Alzheimer's disease, unspecified: Secondary | ICD-10-CM | POA: Diagnosis not present

## 2018-12-15 DIAGNOSIS — G309 Alzheimer's disease, unspecified: Secondary | ICD-10-CM | POA: Diagnosis not present

## 2018-12-15 DIAGNOSIS — M25551 Pain in right hip: Secondary | ICD-10-CM | POA: Diagnosis not present

## 2018-12-15 DIAGNOSIS — R2681 Unsteadiness on feet: Secondary | ICD-10-CM | POA: Diagnosis not present

## 2018-12-15 DIAGNOSIS — J449 Chronic obstructive pulmonary disease, unspecified: Secondary | ICD-10-CM | POA: Diagnosis not present

## 2018-12-15 DIAGNOSIS — F0280 Dementia in other diseases classified elsewhere without behavioral disturbance: Secondary | ICD-10-CM | POA: Diagnosis not present

## 2018-12-15 DIAGNOSIS — I10 Essential (primary) hypertension: Secondary | ICD-10-CM | POA: Diagnosis not present

## 2018-12-18 DIAGNOSIS — M25551 Pain in right hip: Secondary | ICD-10-CM | POA: Diagnosis not present

## 2018-12-18 DIAGNOSIS — F0280 Dementia in other diseases classified elsewhere without behavioral disturbance: Secondary | ICD-10-CM | POA: Diagnosis not present

## 2018-12-18 DIAGNOSIS — G309 Alzheimer's disease, unspecified: Secondary | ICD-10-CM | POA: Diagnosis not present

## 2018-12-18 DIAGNOSIS — I10 Essential (primary) hypertension: Secondary | ICD-10-CM | POA: Diagnosis not present

## 2018-12-18 DIAGNOSIS — R2681 Unsteadiness on feet: Secondary | ICD-10-CM | POA: Diagnosis not present

## 2018-12-18 DIAGNOSIS — J449 Chronic obstructive pulmonary disease, unspecified: Secondary | ICD-10-CM | POA: Diagnosis not present

## 2018-12-19 DIAGNOSIS — J449 Chronic obstructive pulmonary disease, unspecified: Secondary | ICD-10-CM | POA: Diagnosis not present

## 2018-12-19 DIAGNOSIS — M25551 Pain in right hip: Secondary | ICD-10-CM | POA: Diagnosis not present

## 2018-12-19 DIAGNOSIS — I10 Essential (primary) hypertension: Secondary | ICD-10-CM | POA: Diagnosis not present

## 2018-12-19 DIAGNOSIS — G309 Alzheimer's disease, unspecified: Secondary | ICD-10-CM | POA: Diagnosis not present

## 2018-12-19 DIAGNOSIS — R2681 Unsteadiness on feet: Secondary | ICD-10-CM | POA: Diagnosis not present

## 2018-12-19 DIAGNOSIS — F0280 Dementia in other diseases classified elsewhere without behavioral disturbance: Secondary | ICD-10-CM | POA: Diagnosis not present

## 2018-12-20 DIAGNOSIS — G309 Alzheimer's disease, unspecified: Secondary | ICD-10-CM | POA: Diagnosis not present

## 2018-12-20 DIAGNOSIS — R2681 Unsteadiness on feet: Secondary | ICD-10-CM | POA: Diagnosis not present

## 2018-12-20 DIAGNOSIS — J449 Chronic obstructive pulmonary disease, unspecified: Secondary | ICD-10-CM | POA: Diagnosis not present

## 2018-12-20 DIAGNOSIS — F0280 Dementia in other diseases classified elsewhere without behavioral disturbance: Secondary | ICD-10-CM | POA: Diagnosis not present

## 2018-12-20 DIAGNOSIS — M25551 Pain in right hip: Secondary | ICD-10-CM | POA: Diagnosis not present

## 2018-12-20 DIAGNOSIS — I10 Essential (primary) hypertension: Secondary | ICD-10-CM | POA: Diagnosis not present

## 2018-12-21 DIAGNOSIS — G309 Alzheimer's disease, unspecified: Secondary | ICD-10-CM | POA: Diagnosis not present

## 2018-12-21 DIAGNOSIS — M25551 Pain in right hip: Secondary | ICD-10-CM | POA: Diagnosis not present

## 2018-12-21 DIAGNOSIS — R2681 Unsteadiness on feet: Secondary | ICD-10-CM | POA: Diagnosis not present

## 2018-12-21 DIAGNOSIS — I10 Essential (primary) hypertension: Secondary | ICD-10-CM | POA: Diagnosis not present

## 2018-12-21 DIAGNOSIS — J449 Chronic obstructive pulmonary disease, unspecified: Secondary | ICD-10-CM | POA: Diagnosis not present

## 2018-12-21 DIAGNOSIS — F0280 Dementia in other diseases classified elsewhere without behavioral disturbance: Secondary | ICD-10-CM | POA: Diagnosis not present

## 2018-12-21 DIAGNOSIS — M79671 Pain in right foot: Secondary | ICD-10-CM | POA: Diagnosis not present

## 2018-12-22 DIAGNOSIS — I10 Essential (primary) hypertension: Secondary | ICD-10-CM | POA: Diagnosis not present

## 2018-12-22 DIAGNOSIS — F0280 Dementia in other diseases classified elsewhere without behavioral disturbance: Secondary | ICD-10-CM | POA: Diagnosis not present

## 2018-12-22 DIAGNOSIS — G309 Alzheimer's disease, unspecified: Secondary | ICD-10-CM | POA: Diagnosis not present

## 2018-12-22 DIAGNOSIS — M25551 Pain in right hip: Secondary | ICD-10-CM | POA: Diagnosis not present

## 2018-12-22 DIAGNOSIS — J449 Chronic obstructive pulmonary disease, unspecified: Secondary | ICD-10-CM | POA: Diagnosis not present

## 2018-12-22 DIAGNOSIS — R2681 Unsteadiness on feet: Secondary | ICD-10-CM | POA: Diagnosis not present

## 2018-12-25 DIAGNOSIS — R2681 Unsteadiness on feet: Secondary | ICD-10-CM | POA: Diagnosis not present

## 2018-12-25 DIAGNOSIS — F0280 Dementia in other diseases classified elsewhere without behavioral disturbance: Secondary | ICD-10-CM | POA: Diagnosis not present

## 2018-12-25 DIAGNOSIS — G309 Alzheimer's disease, unspecified: Secondary | ICD-10-CM | POA: Diagnosis not present

## 2018-12-25 DIAGNOSIS — M25551 Pain in right hip: Secondary | ICD-10-CM | POA: Diagnosis not present

## 2018-12-25 DIAGNOSIS — J449 Chronic obstructive pulmonary disease, unspecified: Secondary | ICD-10-CM | POA: Diagnosis not present

## 2018-12-25 DIAGNOSIS — I10 Essential (primary) hypertension: Secondary | ICD-10-CM | POA: Diagnosis not present

## 2018-12-26 DIAGNOSIS — I10 Essential (primary) hypertension: Secondary | ICD-10-CM | POA: Diagnosis not present

## 2018-12-26 DIAGNOSIS — M25551 Pain in right hip: Secondary | ICD-10-CM | POA: Diagnosis not present

## 2018-12-26 DIAGNOSIS — J449 Chronic obstructive pulmonary disease, unspecified: Secondary | ICD-10-CM | POA: Diagnosis not present

## 2018-12-26 DIAGNOSIS — G309 Alzheimer's disease, unspecified: Secondary | ICD-10-CM | POA: Diagnosis not present

## 2018-12-26 DIAGNOSIS — F0391 Unspecified dementia with behavioral disturbance: Secondary | ICD-10-CM | POA: Diagnosis not present

## 2018-12-26 DIAGNOSIS — F0280 Dementia in other diseases classified elsewhere without behavioral disturbance: Secondary | ICD-10-CM | POA: Diagnosis not present

## 2018-12-26 DIAGNOSIS — R2681 Unsteadiness on feet: Secondary | ICD-10-CM | POA: Diagnosis not present

## 2018-12-26 DIAGNOSIS — F411 Generalized anxiety disorder: Secondary | ICD-10-CM | POA: Diagnosis not present

## 2018-12-27 DIAGNOSIS — I10 Essential (primary) hypertension: Secondary | ICD-10-CM | POA: Diagnosis not present

## 2018-12-27 DIAGNOSIS — R2681 Unsteadiness on feet: Secondary | ICD-10-CM | POA: Diagnosis not present

## 2018-12-27 DIAGNOSIS — F0280 Dementia in other diseases classified elsewhere without behavioral disturbance: Secondary | ICD-10-CM | POA: Diagnosis not present

## 2018-12-27 DIAGNOSIS — M25551 Pain in right hip: Secondary | ICD-10-CM | POA: Diagnosis not present

## 2018-12-27 DIAGNOSIS — J449 Chronic obstructive pulmonary disease, unspecified: Secondary | ICD-10-CM | POA: Diagnosis not present

## 2018-12-27 DIAGNOSIS — G309 Alzheimer's disease, unspecified: Secondary | ICD-10-CM | POA: Diagnosis not present

## 2018-12-28 DIAGNOSIS — J449 Chronic obstructive pulmonary disease, unspecified: Secondary | ICD-10-CM | POA: Diagnosis not present

## 2018-12-28 DIAGNOSIS — R2681 Unsteadiness on feet: Secondary | ICD-10-CM | POA: Diagnosis not present

## 2018-12-28 DIAGNOSIS — M25551 Pain in right hip: Secondary | ICD-10-CM | POA: Diagnosis not present

## 2018-12-28 DIAGNOSIS — I10 Essential (primary) hypertension: Secondary | ICD-10-CM | POA: Diagnosis not present

## 2018-12-28 DIAGNOSIS — F0280 Dementia in other diseases classified elsewhere without behavioral disturbance: Secondary | ICD-10-CM | POA: Diagnosis not present

## 2018-12-28 DIAGNOSIS — G309 Alzheimer's disease, unspecified: Secondary | ICD-10-CM | POA: Diagnosis not present

## 2018-12-29 DIAGNOSIS — M25551 Pain in right hip: Secondary | ICD-10-CM | POA: Diagnosis not present

## 2018-12-29 DIAGNOSIS — G309 Alzheimer's disease, unspecified: Secondary | ICD-10-CM | POA: Diagnosis not present

## 2018-12-29 DIAGNOSIS — R2681 Unsteadiness on feet: Secondary | ICD-10-CM | POA: Diagnosis not present

## 2018-12-29 DIAGNOSIS — I10 Essential (primary) hypertension: Secondary | ICD-10-CM | POA: Diagnosis not present

## 2018-12-29 DIAGNOSIS — J449 Chronic obstructive pulmonary disease, unspecified: Secondary | ICD-10-CM | POA: Diagnosis not present

## 2018-12-29 DIAGNOSIS — F0280 Dementia in other diseases classified elsewhere without behavioral disturbance: Secondary | ICD-10-CM | POA: Diagnosis not present

## 2019-01-01 DIAGNOSIS — F0280 Dementia in other diseases classified elsewhere without behavioral disturbance: Secondary | ICD-10-CM | POA: Diagnosis not present

## 2019-01-01 DIAGNOSIS — I10 Essential (primary) hypertension: Secondary | ICD-10-CM | POA: Diagnosis not present

## 2019-01-01 DIAGNOSIS — R2681 Unsteadiness on feet: Secondary | ICD-10-CM | POA: Diagnosis not present

## 2019-01-01 DIAGNOSIS — G309 Alzheimer's disease, unspecified: Secondary | ICD-10-CM | POA: Diagnosis not present

## 2019-01-01 DIAGNOSIS — M25551 Pain in right hip: Secondary | ICD-10-CM | POA: Diagnosis not present

## 2019-01-01 DIAGNOSIS — J449 Chronic obstructive pulmonary disease, unspecified: Secondary | ICD-10-CM | POA: Diagnosis not present

## 2019-01-02 DIAGNOSIS — J449 Chronic obstructive pulmonary disease, unspecified: Secondary | ICD-10-CM | POA: Diagnosis not present

## 2019-01-02 DIAGNOSIS — F0280 Dementia in other diseases classified elsewhere without behavioral disturbance: Secondary | ICD-10-CM | POA: Diagnosis not present

## 2019-01-02 DIAGNOSIS — M25551 Pain in right hip: Secondary | ICD-10-CM | POA: Diagnosis not present

## 2019-01-02 DIAGNOSIS — G309 Alzheimer's disease, unspecified: Secondary | ICD-10-CM | POA: Diagnosis not present

## 2019-01-02 DIAGNOSIS — I10 Essential (primary) hypertension: Secondary | ICD-10-CM | POA: Diagnosis not present

## 2019-01-02 DIAGNOSIS — R2681 Unsteadiness on feet: Secondary | ICD-10-CM | POA: Diagnosis not present

## 2019-01-03 DIAGNOSIS — S82034S Nondisplaced transverse fracture of right patella, sequela: Secondary | ICD-10-CM | POA: Diagnosis not present

## 2019-01-03 DIAGNOSIS — R531 Weakness: Secondary | ICD-10-CM | POA: Diagnosis not present

## 2019-01-03 DIAGNOSIS — M25551 Pain in right hip: Secondary | ICD-10-CM | POA: Diagnosis not present

## 2019-01-03 DIAGNOSIS — R278 Other lack of coordination: Secondary | ICD-10-CM | POA: Diagnosis not present

## 2019-01-03 DIAGNOSIS — G309 Alzheimer's disease, unspecified: Secondary | ICD-10-CM | POA: Diagnosis not present

## 2019-01-03 DIAGNOSIS — M6281 Muscle weakness (generalized): Secondary | ICD-10-CM | POA: Diagnosis not present

## 2019-01-03 DIAGNOSIS — J449 Chronic obstructive pulmonary disease, unspecified: Secondary | ICD-10-CM | POA: Diagnosis not present

## 2019-01-03 DIAGNOSIS — M25561 Pain in right knee: Secondary | ICD-10-CM | POA: Diagnosis not present

## 2019-01-03 DIAGNOSIS — R262 Difficulty in walking, not elsewhere classified: Secondary | ICD-10-CM | POA: Diagnosis not present

## 2019-01-03 DIAGNOSIS — I10 Essential (primary) hypertension: Secondary | ICD-10-CM | POA: Diagnosis not present

## 2019-01-03 DIAGNOSIS — F0280 Dementia in other diseases classified elsewhere without behavioral disturbance: Secondary | ICD-10-CM | POA: Diagnosis not present

## 2019-01-03 DIAGNOSIS — R2681 Unsteadiness on feet: Secondary | ICD-10-CM | POA: Diagnosis not present

## 2019-01-04 DIAGNOSIS — I10 Essential (primary) hypertension: Secondary | ICD-10-CM | POA: Diagnosis not present

## 2019-01-04 DIAGNOSIS — G309 Alzheimer's disease, unspecified: Secondary | ICD-10-CM | POA: Diagnosis not present

## 2019-01-04 DIAGNOSIS — F0280 Dementia in other diseases classified elsewhere without behavioral disturbance: Secondary | ICD-10-CM | POA: Diagnosis not present

## 2019-01-04 DIAGNOSIS — R2681 Unsteadiness on feet: Secondary | ICD-10-CM | POA: Diagnosis not present

## 2019-01-04 DIAGNOSIS — J449 Chronic obstructive pulmonary disease, unspecified: Secondary | ICD-10-CM | POA: Diagnosis not present

## 2019-01-04 DIAGNOSIS — M25551 Pain in right hip: Secondary | ICD-10-CM | POA: Diagnosis not present

## 2019-01-05 DIAGNOSIS — J449 Chronic obstructive pulmonary disease, unspecified: Secondary | ICD-10-CM | POA: Diagnosis not present

## 2019-01-05 DIAGNOSIS — F0280 Dementia in other diseases classified elsewhere without behavioral disturbance: Secondary | ICD-10-CM | POA: Diagnosis not present

## 2019-01-05 DIAGNOSIS — I10 Essential (primary) hypertension: Secondary | ICD-10-CM | POA: Diagnosis not present

## 2019-01-05 DIAGNOSIS — G309 Alzheimer's disease, unspecified: Secondary | ICD-10-CM | POA: Diagnosis not present

## 2019-01-05 DIAGNOSIS — R2681 Unsteadiness on feet: Secondary | ICD-10-CM | POA: Diagnosis not present

## 2019-01-05 DIAGNOSIS — M25551 Pain in right hip: Secondary | ICD-10-CM | POA: Diagnosis not present

## 2019-01-30 DIAGNOSIS — M542 Cervicalgia: Secondary | ICD-10-CM | POA: Diagnosis not present

## 2019-01-30 DIAGNOSIS — M25519 Pain in unspecified shoulder: Secondary | ICD-10-CM | POA: Diagnosis not present

## 2019-02-07 DIAGNOSIS — I10 Essential (primary) hypertension: Secondary | ICD-10-CM | POA: Diagnosis not present

## 2019-02-07 DIAGNOSIS — M6281 Muscle weakness (generalized): Secondary | ICD-10-CM | POA: Diagnosis not present

## 2019-02-07 DIAGNOSIS — R262 Difficulty in walking, not elsewhere classified: Secondary | ICD-10-CM | POA: Diagnosis not present

## 2019-02-07 DIAGNOSIS — G309 Alzheimer's disease, unspecified: Secondary | ICD-10-CM | POA: Diagnosis not present

## 2019-02-13 DIAGNOSIS — J302 Other seasonal allergic rhinitis: Secondary | ICD-10-CM | POA: Diagnosis not present

## 2019-02-13 DIAGNOSIS — R0989 Other specified symptoms and signs involving the circulatory and respiratory systems: Secondary | ICD-10-CM | POA: Diagnosis not present

## 2019-02-13 DIAGNOSIS — F5101 Primary insomnia: Secondary | ICD-10-CM | POA: Diagnosis not present

## 2019-02-13 DIAGNOSIS — S41112D Laceration without foreign body of left upper arm, subsequent encounter: Secondary | ICD-10-CM | POA: Diagnosis not present

## 2019-02-13 DIAGNOSIS — R490 Dysphonia: Secondary | ICD-10-CM | POA: Diagnosis not present

## 2019-02-13 DIAGNOSIS — R05 Cough: Secondary | ICD-10-CM | POA: Diagnosis not present

## 2019-02-15 DIAGNOSIS — R05 Cough: Secondary | ICD-10-CM | POA: Diagnosis not present

## 2019-02-15 DIAGNOSIS — R509 Fever, unspecified: Secondary | ICD-10-CM | POA: Diagnosis not present

## 2019-03-16 DIAGNOSIS — Z20828 Contact with and (suspected) exposure to other viral communicable diseases: Secondary | ICD-10-CM | POA: Diagnosis not present

## 2019-03-23 DIAGNOSIS — Z20828 Contact with and (suspected) exposure to other viral communicable diseases: Secondary | ICD-10-CM | POA: Diagnosis not present

## 2019-03-23 DIAGNOSIS — R69 Illness, unspecified: Secondary | ICD-10-CM | POA: Diagnosis not present

## 2019-03-27 DIAGNOSIS — F0281 Dementia in other diseases classified elsewhere with behavioral disturbance: Secondary | ICD-10-CM | POA: Diagnosis not present

## 2019-03-27 DIAGNOSIS — F411 Generalized anxiety disorder: Secondary | ICD-10-CM | POA: Diagnosis not present

## 2019-03-30 DIAGNOSIS — M6281 Muscle weakness (generalized): Secondary | ICD-10-CM | POA: Diagnosis not present

## 2019-03-30 DIAGNOSIS — R531 Weakness: Secondary | ICD-10-CM | POA: Diagnosis not present

## 2019-03-30 DIAGNOSIS — F0280 Dementia in other diseases classified elsewhere without behavioral disturbance: Secondary | ICD-10-CM | POA: Diagnosis not present

## 2019-03-30 DIAGNOSIS — M25551 Pain in right hip: Secondary | ICD-10-CM | POA: Diagnosis not present

## 2019-03-30 DIAGNOSIS — G309 Alzheimer's disease, unspecified: Secondary | ICD-10-CM | POA: Diagnosis not present

## 2019-03-30 DIAGNOSIS — J449 Chronic obstructive pulmonary disease, unspecified: Secondary | ICD-10-CM | POA: Diagnosis not present

## 2019-03-30 DIAGNOSIS — R2681 Unsteadiness on feet: Secondary | ICD-10-CM | POA: Diagnosis not present

## 2019-03-30 DIAGNOSIS — M25561 Pain in right knee: Secondary | ICD-10-CM | POA: Diagnosis not present

## 2019-03-30 DIAGNOSIS — R262 Difficulty in walking, not elsewhere classified: Secondary | ICD-10-CM | POA: Diagnosis not present

## 2019-04-02 DIAGNOSIS — R2681 Unsteadiness on feet: Secondary | ICD-10-CM | POA: Diagnosis not present

## 2019-04-02 DIAGNOSIS — J449 Chronic obstructive pulmonary disease, unspecified: Secondary | ICD-10-CM | POA: Diagnosis not present

## 2019-04-02 DIAGNOSIS — G309 Alzheimer's disease, unspecified: Secondary | ICD-10-CM | POA: Diagnosis not present

## 2019-04-02 DIAGNOSIS — M25551 Pain in right hip: Secondary | ICD-10-CM | POA: Diagnosis not present

## 2019-04-02 DIAGNOSIS — F0280 Dementia in other diseases classified elsewhere without behavioral disturbance: Secondary | ICD-10-CM | POA: Diagnosis not present

## 2019-04-02 DIAGNOSIS — M25561 Pain in right knee: Secondary | ICD-10-CM | POA: Diagnosis not present

## 2019-04-03 DIAGNOSIS — G309 Alzheimer's disease, unspecified: Secondary | ICD-10-CM | POA: Diagnosis not present

## 2019-04-03 DIAGNOSIS — F0280 Dementia in other diseases classified elsewhere without behavioral disturbance: Secondary | ICD-10-CM | POA: Diagnosis not present

## 2019-04-03 DIAGNOSIS — M25551 Pain in right hip: Secondary | ICD-10-CM | POA: Diagnosis not present

## 2019-04-03 DIAGNOSIS — J449 Chronic obstructive pulmonary disease, unspecified: Secondary | ICD-10-CM | POA: Diagnosis not present

## 2019-04-03 DIAGNOSIS — R2681 Unsteadiness on feet: Secondary | ICD-10-CM | POA: Diagnosis not present

## 2019-04-03 DIAGNOSIS — M25561 Pain in right knee: Secondary | ICD-10-CM | POA: Diagnosis not present

## 2019-04-04 DIAGNOSIS — F0280 Dementia in other diseases classified elsewhere without behavioral disturbance: Secondary | ICD-10-CM | POA: Diagnosis not present

## 2019-04-04 DIAGNOSIS — M25551 Pain in right hip: Secondary | ICD-10-CM | POA: Diagnosis not present

## 2019-04-04 DIAGNOSIS — J449 Chronic obstructive pulmonary disease, unspecified: Secondary | ICD-10-CM | POA: Diagnosis not present

## 2019-04-04 DIAGNOSIS — R2681 Unsteadiness on feet: Secondary | ICD-10-CM | POA: Diagnosis not present

## 2019-04-04 DIAGNOSIS — M25561 Pain in right knee: Secondary | ICD-10-CM | POA: Diagnosis not present

## 2019-04-04 DIAGNOSIS — G309 Alzheimer's disease, unspecified: Secondary | ICD-10-CM | POA: Diagnosis not present

## 2019-04-05 DIAGNOSIS — F0280 Dementia in other diseases classified elsewhere without behavioral disturbance: Secondary | ICD-10-CM | POA: Diagnosis not present

## 2019-04-05 DIAGNOSIS — M25551 Pain in right hip: Secondary | ICD-10-CM | POA: Diagnosis not present

## 2019-04-05 DIAGNOSIS — J449 Chronic obstructive pulmonary disease, unspecified: Secondary | ICD-10-CM | POA: Diagnosis not present

## 2019-04-05 DIAGNOSIS — M25561 Pain in right knee: Secondary | ICD-10-CM | POA: Diagnosis not present

## 2019-04-05 DIAGNOSIS — R262 Difficulty in walking, not elsewhere classified: Secondary | ICD-10-CM | POA: Diagnosis not present

## 2019-04-05 DIAGNOSIS — R2681 Unsteadiness on feet: Secondary | ICD-10-CM | POA: Diagnosis not present

## 2019-04-05 DIAGNOSIS — G309 Alzheimer's disease, unspecified: Secondary | ICD-10-CM | POA: Diagnosis not present

## 2019-04-05 DIAGNOSIS — M6281 Muscle weakness (generalized): Secondary | ICD-10-CM | POA: Diagnosis not present

## 2019-04-05 DIAGNOSIS — R531 Weakness: Secondary | ICD-10-CM | POA: Diagnosis not present

## 2019-04-06 DIAGNOSIS — J449 Chronic obstructive pulmonary disease, unspecified: Secondary | ICD-10-CM | POA: Diagnosis not present

## 2019-04-06 DIAGNOSIS — M25551 Pain in right hip: Secondary | ICD-10-CM | POA: Diagnosis not present

## 2019-04-06 DIAGNOSIS — G309 Alzheimer's disease, unspecified: Secondary | ICD-10-CM | POA: Diagnosis not present

## 2019-04-06 DIAGNOSIS — R2681 Unsteadiness on feet: Secondary | ICD-10-CM | POA: Diagnosis not present

## 2019-04-06 DIAGNOSIS — F0280 Dementia in other diseases classified elsewhere without behavioral disturbance: Secondary | ICD-10-CM | POA: Diagnosis not present

## 2019-04-06 DIAGNOSIS — M25561 Pain in right knee: Secondary | ICD-10-CM | POA: Diagnosis not present

## 2019-04-09 DIAGNOSIS — R2681 Unsteadiness on feet: Secondary | ICD-10-CM | POA: Diagnosis not present

## 2019-04-09 DIAGNOSIS — F0280 Dementia in other diseases classified elsewhere without behavioral disturbance: Secondary | ICD-10-CM | POA: Diagnosis not present

## 2019-04-09 DIAGNOSIS — M25551 Pain in right hip: Secondary | ICD-10-CM | POA: Diagnosis not present

## 2019-04-09 DIAGNOSIS — G309 Alzheimer's disease, unspecified: Secondary | ICD-10-CM | POA: Diagnosis not present

## 2019-04-09 DIAGNOSIS — M25561 Pain in right knee: Secondary | ICD-10-CM | POA: Diagnosis not present

## 2019-04-09 DIAGNOSIS — J449 Chronic obstructive pulmonary disease, unspecified: Secondary | ICD-10-CM | POA: Diagnosis not present

## 2019-04-10 DIAGNOSIS — R2681 Unsteadiness on feet: Secondary | ICD-10-CM | POA: Diagnosis not present

## 2019-04-10 DIAGNOSIS — G309 Alzheimer's disease, unspecified: Secondary | ICD-10-CM | POA: Diagnosis not present

## 2019-04-10 DIAGNOSIS — J449 Chronic obstructive pulmonary disease, unspecified: Secondary | ICD-10-CM | POA: Diagnosis not present

## 2019-04-10 DIAGNOSIS — F0280 Dementia in other diseases classified elsewhere without behavioral disturbance: Secondary | ICD-10-CM | POA: Diagnosis not present

## 2019-04-10 DIAGNOSIS — M25561 Pain in right knee: Secondary | ICD-10-CM | POA: Diagnosis not present

## 2019-04-10 DIAGNOSIS — M25551 Pain in right hip: Secondary | ICD-10-CM | POA: Diagnosis not present

## 2019-04-11 DIAGNOSIS — M25551 Pain in right hip: Secondary | ICD-10-CM | POA: Diagnosis not present

## 2019-04-11 DIAGNOSIS — G309 Alzheimer's disease, unspecified: Secondary | ICD-10-CM | POA: Diagnosis not present

## 2019-04-11 DIAGNOSIS — J449 Chronic obstructive pulmonary disease, unspecified: Secondary | ICD-10-CM | POA: Diagnosis not present

## 2019-04-11 DIAGNOSIS — F0280 Dementia in other diseases classified elsewhere without behavioral disturbance: Secondary | ICD-10-CM | POA: Diagnosis not present

## 2019-04-11 DIAGNOSIS — R2681 Unsteadiness on feet: Secondary | ICD-10-CM | POA: Diagnosis not present

## 2019-04-11 DIAGNOSIS — M25561 Pain in right knee: Secondary | ICD-10-CM | POA: Diagnosis not present

## 2019-04-12 DIAGNOSIS — M25551 Pain in right hip: Secondary | ICD-10-CM | POA: Diagnosis not present

## 2019-04-12 DIAGNOSIS — R2681 Unsteadiness on feet: Secondary | ICD-10-CM | POA: Diagnosis not present

## 2019-04-12 DIAGNOSIS — J449 Chronic obstructive pulmonary disease, unspecified: Secondary | ICD-10-CM | POA: Diagnosis not present

## 2019-04-12 DIAGNOSIS — G309 Alzheimer's disease, unspecified: Secondary | ICD-10-CM | POA: Diagnosis not present

## 2019-04-12 DIAGNOSIS — M25561 Pain in right knee: Secondary | ICD-10-CM | POA: Diagnosis not present

## 2019-04-12 DIAGNOSIS — F0280 Dementia in other diseases classified elsewhere without behavioral disturbance: Secondary | ICD-10-CM | POA: Diagnosis not present

## 2019-04-13 DIAGNOSIS — M25561 Pain in right knee: Secondary | ICD-10-CM | POA: Diagnosis not present

## 2019-04-13 DIAGNOSIS — R2681 Unsteadiness on feet: Secondary | ICD-10-CM | POA: Diagnosis not present

## 2019-04-13 DIAGNOSIS — F0280 Dementia in other diseases classified elsewhere without behavioral disturbance: Secondary | ICD-10-CM | POA: Diagnosis not present

## 2019-04-13 DIAGNOSIS — J449 Chronic obstructive pulmonary disease, unspecified: Secondary | ICD-10-CM | POA: Diagnosis not present

## 2019-04-13 DIAGNOSIS — M25551 Pain in right hip: Secondary | ICD-10-CM | POA: Diagnosis not present

## 2019-04-13 DIAGNOSIS — G309 Alzheimer's disease, unspecified: Secondary | ICD-10-CM | POA: Diagnosis not present

## 2019-04-16 DIAGNOSIS — M25551 Pain in right hip: Secondary | ICD-10-CM | POA: Diagnosis not present

## 2019-04-16 DIAGNOSIS — R2681 Unsteadiness on feet: Secondary | ICD-10-CM | POA: Diagnosis not present

## 2019-04-16 DIAGNOSIS — G309 Alzheimer's disease, unspecified: Secondary | ICD-10-CM | POA: Diagnosis not present

## 2019-04-16 DIAGNOSIS — J449 Chronic obstructive pulmonary disease, unspecified: Secondary | ICD-10-CM | POA: Diagnosis not present

## 2019-04-16 DIAGNOSIS — F0280 Dementia in other diseases classified elsewhere without behavioral disturbance: Secondary | ICD-10-CM | POA: Diagnosis not present

## 2019-04-16 DIAGNOSIS — M25561 Pain in right knee: Secondary | ICD-10-CM | POA: Diagnosis not present

## 2019-04-17 DIAGNOSIS — J449 Chronic obstructive pulmonary disease, unspecified: Secondary | ICD-10-CM | POA: Diagnosis not present

## 2019-04-17 DIAGNOSIS — G309 Alzheimer's disease, unspecified: Secondary | ICD-10-CM | POA: Diagnosis not present

## 2019-04-17 DIAGNOSIS — F0280 Dementia in other diseases classified elsewhere without behavioral disturbance: Secondary | ICD-10-CM | POA: Diagnosis not present

## 2019-04-17 DIAGNOSIS — M25551 Pain in right hip: Secondary | ICD-10-CM | POA: Diagnosis not present

## 2019-04-17 DIAGNOSIS — M25561 Pain in right knee: Secondary | ICD-10-CM | POA: Diagnosis not present

## 2019-04-17 DIAGNOSIS — R2681 Unsteadiness on feet: Secondary | ICD-10-CM | POA: Diagnosis not present

## 2019-04-18 DIAGNOSIS — J449 Chronic obstructive pulmonary disease, unspecified: Secondary | ICD-10-CM | POA: Diagnosis not present

## 2019-04-18 DIAGNOSIS — M25561 Pain in right knee: Secondary | ICD-10-CM | POA: Diagnosis not present

## 2019-04-18 DIAGNOSIS — M25551 Pain in right hip: Secondary | ICD-10-CM | POA: Diagnosis not present

## 2019-04-18 DIAGNOSIS — G309 Alzheimer's disease, unspecified: Secondary | ICD-10-CM | POA: Diagnosis not present

## 2019-04-18 DIAGNOSIS — F0280 Dementia in other diseases classified elsewhere without behavioral disturbance: Secondary | ICD-10-CM | POA: Diagnosis not present

## 2019-04-18 DIAGNOSIS — R2681 Unsteadiness on feet: Secondary | ICD-10-CM | POA: Diagnosis not present

## 2019-04-19 DIAGNOSIS — M25551 Pain in right hip: Secondary | ICD-10-CM | POA: Diagnosis not present

## 2019-04-19 DIAGNOSIS — M25561 Pain in right knee: Secondary | ICD-10-CM | POA: Diagnosis not present

## 2019-04-19 DIAGNOSIS — R2681 Unsteadiness on feet: Secondary | ICD-10-CM | POA: Diagnosis not present

## 2019-04-19 DIAGNOSIS — F0280 Dementia in other diseases classified elsewhere without behavioral disturbance: Secondary | ICD-10-CM | POA: Diagnosis not present

## 2019-04-19 DIAGNOSIS — J449 Chronic obstructive pulmonary disease, unspecified: Secondary | ICD-10-CM | POA: Diagnosis not present

## 2019-04-19 DIAGNOSIS — G309 Alzheimer's disease, unspecified: Secondary | ICD-10-CM | POA: Diagnosis not present

## 2019-04-20 DIAGNOSIS — R2681 Unsteadiness on feet: Secondary | ICD-10-CM | POA: Diagnosis not present

## 2019-04-20 DIAGNOSIS — F0280 Dementia in other diseases classified elsewhere without behavioral disturbance: Secondary | ICD-10-CM | POA: Diagnosis not present

## 2019-04-20 DIAGNOSIS — J449 Chronic obstructive pulmonary disease, unspecified: Secondary | ICD-10-CM | POA: Diagnosis not present

## 2019-04-20 DIAGNOSIS — M25561 Pain in right knee: Secondary | ICD-10-CM | POA: Diagnosis not present

## 2019-04-20 DIAGNOSIS — M25551 Pain in right hip: Secondary | ICD-10-CM | POA: Diagnosis not present

## 2019-04-20 DIAGNOSIS — G309 Alzheimer's disease, unspecified: Secondary | ICD-10-CM | POA: Diagnosis not present

## 2019-04-22 DIAGNOSIS — M25551 Pain in right hip: Secondary | ICD-10-CM | POA: Diagnosis not present

## 2019-04-22 DIAGNOSIS — G309 Alzheimer's disease, unspecified: Secondary | ICD-10-CM | POA: Diagnosis not present

## 2019-04-22 DIAGNOSIS — R2681 Unsteadiness on feet: Secondary | ICD-10-CM | POA: Diagnosis not present

## 2019-04-22 DIAGNOSIS — F0280 Dementia in other diseases classified elsewhere without behavioral disturbance: Secondary | ICD-10-CM | POA: Diagnosis not present

## 2019-04-22 DIAGNOSIS — M25561 Pain in right knee: Secondary | ICD-10-CM | POA: Diagnosis not present

## 2019-04-22 DIAGNOSIS — J449 Chronic obstructive pulmonary disease, unspecified: Secondary | ICD-10-CM | POA: Diagnosis not present

## 2019-04-23 DIAGNOSIS — M25551 Pain in right hip: Secondary | ICD-10-CM | POA: Diagnosis not present

## 2019-04-23 DIAGNOSIS — G309 Alzheimer's disease, unspecified: Secondary | ICD-10-CM | POA: Diagnosis not present

## 2019-04-23 DIAGNOSIS — R05 Cough: Secondary | ICD-10-CM | POA: Diagnosis not present

## 2019-04-23 DIAGNOSIS — J441 Chronic obstructive pulmonary disease with (acute) exacerbation: Secondary | ICD-10-CM | POA: Diagnosis not present

## 2019-04-23 DIAGNOSIS — J449 Chronic obstructive pulmonary disease, unspecified: Secondary | ICD-10-CM | POA: Diagnosis not present

## 2019-04-23 DIAGNOSIS — F0280 Dementia in other diseases classified elsewhere without behavioral disturbance: Secondary | ICD-10-CM | POA: Diagnosis not present

## 2019-04-23 DIAGNOSIS — R2681 Unsteadiness on feet: Secondary | ICD-10-CM | POA: Diagnosis not present

## 2019-04-23 DIAGNOSIS — M25561 Pain in right knee: Secondary | ICD-10-CM | POA: Diagnosis not present

## 2019-04-23 DIAGNOSIS — R062 Wheezing: Secondary | ICD-10-CM | POA: Diagnosis not present

## 2019-04-23 DIAGNOSIS — M6281 Muscle weakness (generalized): Secondary | ICD-10-CM | POA: Diagnosis not present

## 2019-04-24 DIAGNOSIS — M25551 Pain in right hip: Secondary | ICD-10-CM | POA: Diagnosis not present

## 2019-04-24 DIAGNOSIS — Z79899 Other long term (current) drug therapy: Secondary | ICD-10-CM | POA: Diagnosis not present

## 2019-04-24 DIAGNOSIS — M25561 Pain in right knee: Secondary | ICD-10-CM | POA: Diagnosis not present

## 2019-04-24 DIAGNOSIS — D649 Anemia, unspecified: Secondary | ICD-10-CM | POA: Diagnosis not present

## 2019-04-24 DIAGNOSIS — R2681 Unsteadiness on feet: Secondary | ICD-10-CM | POA: Diagnosis not present

## 2019-04-24 DIAGNOSIS — J449 Chronic obstructive pulmonary disease, unspecified: Secondary | ICD-10-CM | POA: Diagnosis not present

## 2019-04-24 DIAGNOSIS — G309 Alzheimer's disease, unspecified: Secondary | ICD-10-CM | POA: Diagnosis not present

## 2019-04-24 DIAGNOSIS — R05 Cough: Secondary | ICD-10-CM | POA: Diagnosis not present

## 2019-04-24 DIAGNOSIS — R0989 Other specified symptoms and signs involving the circulatory and respiratory systems: Secondary | ICD-10-CM | POA: Diagnosis not present

## 2019-04-24 DIAGNOSIS — F0280 Dementia in other diseases classified elsewhere without behavioral disturbance: Secondary | ICD-10-CM | POA: Diagnosis not present

## 2019-04-25 DIAGNOSIS — G309 Alzheimer's disease, unspecified: Secondary | ICD-10-CM | POA: Diagnosis not present

## 2019-04-25 DIAGNOSIS — M25551 Pain in right hip: Secondary | ICD-10-CM | POA: Diagnosis not present

## 2019-04-25 DIAGNOSIS — R2681 Unsteadiness on feet: Secondary | ICD-10-CM | POA: Diagnosis not present

## 2019-04-25 DIAGNOSIS — F0280 Dementia in other diseases classified elsewhere without behavioral disturbance: Secondary | ICD-10-CM | POA: Diagnosis not present

## 2019-04-25 DIAGNOSIS — J449 Chronic obstructive pulmonary disease, unspecified: Secondary | ICD-10-CM | POA: Diagnosis not present

## 2019-04-25 DIAGNOSIS — M25561 Pain in right knee: Secondary | ICD-10-CM | POA: Diagnosis not present

## 2019-04-26 DIAGNOSIS — R2681 Unsteadiness on feet: Secondary | ICD-10-CM | POA: Diagnosis not present

## 2019-04-26 DIAGNOSIS — F0280 Dementia in other diseases classified elsewhere without behavioral disturbance: Secondary | ICD-10-CM | POA: Diagnosis not present

## 2019-04-26 DIAGNOSIS — M25561 Pain in right knee: Secondary | ICD-10-CM | POA: Diagnosis not present

## 2019-04-26 DIAGNOSIS — J449 Chronic obstructive pulmonary disease, unspecified: Secondary | ICD-10-CM | POA: Diagnosis not present

## 2019-04-26 DIAGNOSIS — G309 Alzheimer's disease, unspecified: Secondary | ICD-10-CM | POA: Diagnosis not present

## 2019-04-26 DIAGNOSIS — J441 Chronic obstructive pulmonary disease with (acute) exacerbation: Secondary | ICD-10-CM | POA: Diagnosis not present

## 2019-04-26 DIAGNOSIS — M25551 Pain in right hip: Secondary | ICD-10-CM | POA: Diagnosis not present

## 2019-04-27 DIAGNOSIS — M25561 Pain in right knee: Secondary | ICD-10-CM | POA: Diagnosis not present

## 2019-04-27 DIAGNOSIS — R2681 Unsteadiness on feet: Secondary | ICD-10-CM | POA: Diagnosis not present

## 2019-04-27 DIAGNOSIS — F0280 Dementia in other diseases classified elsewhere without behavioral disturbance: Secondary | ICD-10-CM | POA: Diagnosis not present

## 2019-04-27 DIAGNOSIS — G309 Alzheimer's disease, unspecified: Secondary | ICD-10-CM | POA: Diagnosis not present

## 2019-04-27 DIAGNOSIS — J449 Chronic obstructive pulmonary disease, unspecified: Secondary | ICD-10-CM | POA: Diagnosis not present

## 2019-04-27 DIAGNOSIS — M25551 Pain in right hip: Secondary | ICD-10-CM | POA: Diagnosis not present

## 2019-04-29 DIAGNOSIS — M25561 Pain in right knee: Secondary | ICD-10-CM | POA: Diagnosis not present

## 2019-04-29 DIAGNOSIS — F0280 Dementia in other diseases classified elsewhere without behavioral disturbance: Secondary | ICD-10-CM | POA: Diagnosis not present

## 2019-04-29 DIAGNOSIS — M25551 Pain in right hip: Secondary | ICD-10-CM | POA: Diagnosis not present

## 2019-04-29 DIAGNOSIS — R2681 Unsteadiness on feet: Secondary | ICD-10-CM | POA: Diagnosis not present

## 2019-04-29 DIAGNOSIS — G309 Alzheimer's disease, unspecified: Secondary | ICD-10-CM | POA: Diagnosis not present

## 2019-04-29 DIAGNOSIS — J449 Chronic obstructive pulmonary disease, unspecified: Secondary | ICD-10-CM | POA: Diagnosis not present

## 2019-04-30 DIAGNOSIS — J449 Chronic obstructive pulmonary disease, unspecified: Secondary | ICD-10-CM | POA: Diagnosis not present

## 2019-04-30 DIAGNOSIS — F0280 Dementia in other diseases classified elsewhere without behavioral disturbance: Secondary | ICD-10-CM | POA: Diagnosis not present

## 2019-04-30 DIAGNOSIS — G309 Alzheimer's disease, unspecified: Secondary | ICD-10-CM | POA: Diagnosis not present

## 2019-04-30 DIAGNOSIS — R2681 Unsteadiness on feet: Secondary | ICD-10-CM | POA: Diagnosis not present

## 2019-04-30 DIAGNOSIS — M25551 Pain in right hip: Secondary | ICD-10-CM | POA: Diagnosis not present

## 2019-04-30 DIAGNOSIS — M25561 Pain in right knee: Secondary | ICD-10-CM | POA: Diagnosis not present

## 2019-05-01 DIAGNOSIS — R2681 Unsteadiness on feet: Secondary | ICD-10-CM | POA: Diagnosis not present

## 2019-05-01 DIAGNOSIS — F0280 Dementia in other diseases classified elsewhere without behavioral disturbance: Secondary | ICD-10-CM | POA: Diagnosis not present

## 2019-05-01 DIAGNOSIS — M25551 Pain in right hip: Secondary | ICD-10-CM | POA: Diagnosis not present

## 2019-05-01 DIAGNOSIS — M25561 Pain in right knee: Secondary | ICD-10-CM | POA: Diagnosis not present

## 2019-05-01 DIAGNOSIS — G309 Alzheimer's disease, unspecified: Secondary | ICD-10-CM | POA: Diagnosis not present

## 2019-05-01 DIAGNOSIS — J449 Chronic obstructive pulmonary disease, unspecified: Secondary | ICD-10-CM | POA: Diagnosis not present

## 2019-05-02 DIAGNOSIS — F0280 Dementia in other diseases classified elsewhere without behavioral disturbance: Secondary | ICD-10-CM | POA: Diagnosis not present

## 2019-05-02 DIAGNOSIS — M25561 Pain in right knee: Secondary | ICD-10-CM | POA: Diagnosis not present

## 2019-05-02 DIAGNOSIS — J449 Chronic obstructive pulmonary disease, unspecified: Secondary | ICD-10-CM | POA: Diagnosis not present

## 2019-05-02 DIAGNOSIS — R2681 Unsteadiness on feet: Secondary | ICD-10-CM | POA: Diagnosis not present

## 2019-05-02 DIAGNOSIS — G309 Alzheimer's disease, unspecified: Secondary | ICD-10-CM | POA: Diagnosis not present

## 2019-05-02 DIAGNOSIS — M25551 Pain in right hip: Secondary | ICD-10-CM | POA: Diagnosis not present

## 2019-05-03 DIAGNOSIS — R2681 Unsteadiness on feet: Secondary | ICD-10-CM | POA: Diagnosis not present

## 2019-05-03 DIAGNOSIS — G309 Alzheimer's disease, unspecified: Secondary | ICD-10-CM | POA: Diagnosis not present

## 2019-05-03 DIAGNOSIS — F0280 Dementia in other diseases classified elsewhere without behavioral disturbance: Secondary | ICD-10-CM | POA: Diagnosis not present

## 2019-05-03 DIAGNOSIS — J449 Chronic obstructive pulmonary disease, unspecified: Secondary | ICD-10-CM | POA: Diagnosis not present

## 2019-05-03 DIAGNOSIS — M25551 Pain in right hip: Secondary | ICD-10-CM | POA: Diagnosis not present

## 2019-05-03 DIAGNOSIS — M25561 Pain in right knee: Secondary | ICD-10-CM | POA: Diagnosis not present

## 2019-05-04 DIAGNOSIS — F0280 Dementia in other diseases classified elsewhere without behavioral disturbance: Secondary | ICD-10-CM | POA: Diagnosis not present

## 2019-05-04 DIAGNOSIS — R2681 Unsteadiness on feet: Secondary | ICD-10-CM | POA: Diagnosis not present

## 2019-05-04 DIAGNOSIS — M25551 Pain in right hip: Secondary | ICD-10-CM | POA: Diagnosis not present

## 2019-05-04 DIAGNOSIS — J449 Chronic obstructive pulmonary disease, unspecified: Secondary | ICD-10-CM | POA: Diagnosis not present

## 2019-05-04 DIAGNOSIS — M25561 Pain in right knee: Secondary | ICD-10-CM | POA: Diagnosis not present

## 2019-05-04 DIAGNOSIS — G309 Alzheimer's disease, unspecified: Secondary | ICD-10-CM | POA: Diagnosis not present

## 2019-07-13 DIAGNOSIS — Z23 Encounter for immunization: Secondary | ICD-10-CM | POA: Diagnosis not present

## 2019-07-19 DIAGNOSIS — F5101 Primary insomnia: Secondary | ICD-10-CM | POA: Diagnosis not present

## 2019-07-19 DIAGNOSIS — F0281 Dementia in other diseases classified elsewhere with behavioral disturbance: Secondary | ICD-10-CM | POA: Diagnosis not present

## 2019-07-19 DIAGNOSIS — F411 Generalized anxiety disorder: Secondary | ICD-10-CM | POA: Diagnosis not present

## 2019-07-20 DIAGNOSIS — J449 Chronic obstructive pulmonary disease, unspecified: Secondary | ICD-10-CM | POA: Diagnosis not present

## 2019-07-20 DIAGNOSIS — M25551 Pain in right hip: Secondary | ICD-10-CM | POA: Diagnosis not present

## 2019-07-20 DIAGNOSIS — R262 Difficulty in walking, not elsewhere classified: Secondary | ICD-10-CM | POA: Diagnosis not present

## 2019-07-20 DIAGNOSIS — F0280 Dementia in other diseases classified elsewhere without behavioral disturbance: Secondary | ICD-10-CM | POA: Diagnosis not present

## 2019-07-20 DIAGNOSIS — M6281 Muscle weakness (generalized): Secondary | ICD-10-CM | POA: Diagnosis not present

## 2019-07-20 DIAGNOSIS — G309 Alzheimer's disease, unspecified: Secondary | ICD-10-CM | POA: Diagnosis not present

## 2019-07-20 DIAGNOSIS — R2681 Unsteadiness on feet: Secondary | ICD-10-CM | POA: Diagnosis not present

## 2019-07-20 DIAGNOSIS — M25561 Pain in right knee: Secondary | ICD-10-CM | POA: Diagnosis not present

## 2019-07-20 DIAGNOSIS — R531 Weakness: Secondary | ICD-10-CM | POA: Diagnosis not present

## 2019-07-23 DIAGNOSIS — M25561 Pain in right knee: Secondary | ICD-10-CM | POA: Diagnosis not present

## 2019-07-23 DIAGNOSIS — F0280 Dementia in other diseases classified elsewhere without behavioral disturbance: Secondary | ICD-10-CM | POA: Diagnosis not present

## 2019-07-23 DIAGNOSIS — M25551 Pain in right hip: Secondary | ICD-10-CM | POA: Diagnosis not present

## 2019-07-23 DIAGNOSIS — R2681 Unsteadiness on feet: Secondary | ICD-10-CM | POA: Diagnosis not present

## 2019-07-23 DIAGNOSIS — J449 Chronic obstructive pulmonary disease, unspecified: Secondary | ICD-10-CM | POA: Diagnosis not present

## 2019-07-23 DIAGNOSIS — G309 Alzheimer's disease, unspecified: Secondary | ICD-10-CM | POA: Diagnosis not present

## 2019-07-25 DIAGNOSIS — J449 Chronic obstructive pulmonary disease, unspecified: Secondary | ICD-10-CM | POA: Diagnosis not present

## 2019-07-25 DIAGNOSIS — M25551 Pain in right hip: Secondary | ICD-10-CM | POA: Diagnosis not present

## 2019-07-25 DIAGNOSIS — F0280 Dementia in other diseases classified elsewhere without behavioral disturbance: Secondary | ICD-10-CM | POA: Diagnosis not present

## 2019-07-25 DIAGNOSIS — M25561 Pain in right knee: Secondary | ICD-10-CM | POA: Diagnosis not present

## 2019-07-25 DIAGNOSIS — G309 Alzheimer's disease, unspecified: Secondary | ICD-10-CM | POA: Diagnosis not present

## 2019-07-25 DIAGNOSIS — R2681 Unsteadiness on feet: Secondary | ICD-10-CM | POA: Diagnosis not present

## 2019-07-26 DIAGNOSIS — M25561 Pain in right knee: Secondary | ICD-10-CM | POA: Diagnosis not present

## 2019-07-26 DIAGNOSIS — F0280 Dementia in other diseases classified elsewhere without behavioral disturbance: Secondary | ICD-10-CM | POA: Diagnosis not present

## 2019-07-26 DIAGNOSIS — G309 Alzheimer's disease, unspecified: Secondary | ICD-10-CM | POA: Diagnosis not present

## 2019-07-26 DIAGNOSIS — R2681 Unsteadiness on feet: Secondary | ICD-10-CM | POA: Diagnosis not present

## 2019-07-26 DIAGNOSIS — J449 Chronic obstructive pulmonary disease, unspecified: Secondary | ICD-10-CM | POA: Diagnosis not present

## 2019-07-26 DIAGNOSIS — M25551 Pain in right hip: Secondary | ICD-10-CM | POA: Diagnosis not present

## 2019-07-27 DIAGNOSIS — J449 Chronic obstructive pulmonary disease, unspecified: Secondary | ICD-10-CM | POA: Diagnosis not present

## 2019-07-27 DIAGNOSIS — F0280 Dementia in other diseases classified elsewhere without behavioral disturbance: Secondary | ICD-10-CM | POA: Diagnosis not present

## 2019-07-27 DIAGNOSIS — G309 Alzheimer's disease, unspecified: Secondary | ICD-10-CM | POA: Diagnosis not present

## 2019-07-27 DIAGNOSIS — R2681 Unsteadiness on feet: Secondary | ICD-10-CM | POA: Diagnosis not present

## 2019-07-27 DIAGNOSIS — M25551 Pain in right hip: Secondary | ICD-10-CM | POA: Diagnosis not present

## 2019-07-27 DIAGNOSIS — M25561 Pain in right knee: Secondary | ICD-10-CM | POA: Diagnosis not present

## 2019-07-30 DIAGNOSIS — F0280 Dementia in other diseases classified elsewhere without behavioral disturbance: Secondary | ICD-10-CM | POA: Diagnosis not present

## 2019-07-30 DIAGNOSIS — M25561 Pain in right knee: Secondary | ICD-10-CM | POA: Diagnosis not present

## 2019-07-30 DIAGNOSIS — M25551 Pain in right hip: Secondary | ICD-10-CM | POA: Diagnosis not present

## 2019-07-30 DIAGNOSIS — G309 Alzheimer's disease, unspecified: Secondary | ICD-10-CM | POA: Diagnosis not present

## 2019-07-30 DIAGNOSIS — R2681 Unsteadiness on feet: Secondary | ICD-10-CM | POA: Diagnosis not present

## 2019-07-30 DIAGNOSIS — H109 Unspecified conjunctivitis: Secondary | ICD-10-CM | POA: Diagnosis not present

## 2019-07-30 DIAGNOSIS — J449 Chronic obstructive pulmonary disease, unspecified: Secondary | ICD-10-CM | POA: Diagnosis not present

## 2019-07-31 DIAGNOSIS — J449 Chronic obstructive pulmonary disease, unspecified: Secondary | ICD-10-CM | POA: Diagnosis not present

## 2019-07-31 DIAGNOSIS — F0281 Dementia in other diseases classified elsewhere with behavioral disturbance: Secondary | ICD-10-CM | POA: Diagnosis not present

## 2019-07-31 DIAGNOSIS — F0280 Dementia in other diseases classified elsewhere without behavioral disturbance: Secondary | ICD-10-CM | POA: Diagnosis not present

## 2019-07-31 DIAGNOSIS — M25561 Pain in right knee: Secondary | ICD-10-CM | POA: Diagnosis not present

## 2019-07-31 DIAGNOSIS — F5101 Primary insomnia: Secondary | ICD-10-CM | POA: Diagnosis not present

## 2019-07-31 DIAGNOSIS — R2681 Unsteadiness on feet: Secondary | ICD-10-CM | POA: Diagnosis not present

## 2019-07-31 DIAGNOSIS — F411 Generalized anxiety disorder: Secondary | ICD-10-CM | POA: Diagnosis not present

## 2019-07-31 DIAGNOSIS — G309 Alzheimer's disease, unspecified: Secondary | ICD-10-CM | POA: Diagnosis not present

## 2019-07-31 DIAGNOSIS — M25551 Pain in right hip: Secondary | ICD-10-CM | POA: Diagnosis not present

## 2019-08-02 DIAGNOSIS — F0280 Dementia in other diseases classified elsewhere without behavioral disturbance: Secondary | ICD-10-CM | POA: Diagnosis not present

## 2019-08-02 DIAGNOSIS — R2681 Unsteadiness on feet: Secondary | ICD-10-CM | POA: Diagnosis not present

## 2019-08-02 DIAGNOSIS — M25561 Pain in right knee: Secondary | ICD-10-CM | POA: Diagnosis not present

## 2019-08-02 DIAGNOSIS — G309 Alzheimer's disease, unspecified: Secondary | ICD-10-CM | POA: Diagnosis not present

## 2019-08-02 DIAGNOSIS — M25551 Pain in right hip: Secondary | ICD-10-CM | POA: Diagnosis not present

## 2019-08-02 DIAGNOSIS — J449 Chronic obstructive pulmonary disease, unspecified: Secondary | ICD-10-CM | POA: Diagnosis not present

## 2019-08-03 DIAGNOSIS — F0280 Dementia in other diseases classified elsewhere without behavioral disturbance: Secondary | ICD-10-CM | POA: Diagnosis not present

## 2019-08-03 DIAGNOSIS — J449 Chronic obstructive pulmonary disease, unspecified: Secondary | ICD-10-CM | POA: Diagnosis not present

## 2019-08-03 DIAGNOSIS — M25551 Pain in right hip: Secondary | ICD-10-CM | POA: Diagnosis not present

## 2019-08-03 DIAGNOSIS — R2681 Unsteadiness on feet: Secondary | ICD-10-CM | POA: Diagnosis not present

## 2019-08-03 DIAGNOSIS — G309 Alzheimer's disease, unspecified: Secondary | ICD-10-CM | POA: Diagnosis not present

## 2019-08-03 DIAGNOSIS — M25561 Pain in right knee: Secondary | ICD-10-CM | POA: Diagnosis not present

## 2019-08-03 DIAGNOSIS — Z23 Encounter for immunization: Secondary | ICD-10-CM | POA: Diagnosis not present

## 2019-08-06 DIAGNOSIS — F5101 Primary insomnia: Secondary | ICD-10-CM | POA: Diagnosis not present

## 2019-08-06 DIAGNOSIS — F0281 Dementia in other diseases classified elsewhere with behavioral disturbance: Secondary | ICD-10-CM | POA: Diagnosis not present

## 2019-08-07 DIAGNOSIS — I69091 Dysphagia following nontraumatic subarachnoid hemorrhage: Secondary | ICD-10-CM | POA: Diagnosis not present

## 2019-08-07 DIAGNOSIS — F0280 Dementia in other diseases classified elsewhere without behavioral disturbance: Secondary | ICD-10-CM | POA: Diagnosis not present

## 2019-08-07 DIAGNOSIS — R1312 Dysphagia, oropharyngeal phase: Secondary | ICD-10-CM | POA: Diagnosis not present

## 2019-08-07 DIAGNOSIS — R41841 Cognitive communication deficit: Secondary | ICD-10-CM | POA: Diagnosis not present

## 2019-08-07 DIAGNOSIS — H109 Unspecified conjunctivitis: Secondary | ICD-10-CM | POA: Diagnosis not present

## 2019-08-07 DIAGNOSIS — J449 Chronic obstructive pulmonary disease, unspecified: Secondary | ICD-10-CM | POA: Diagnosis not present

## 2019-08-08 DIAGNOSIS — R1312 Dysphagia, oropharyngeal phase: Secondary | ICD-10-CM | POA: Diagnosis not present

## 2019-08-08 DIAGNOSIS — R41841 Cognitive communication deficit: Secondary | ICD-10-CM | POA: Diagnosis not present

## 2019-08-08 DIAGNOSIS — F0280 Dementia in other diseases classified elsewhere without behavioral disturbance: Secondary | ICD-10-CM | POA: Diagnosis not present

## 2019-08-08 DIAGNOSIS — I69091 Dysphagia following nontraumatic subarachnoid hemorrhage: Secondary | ICD-10-CM | POA: Diagnosis not present

## 2019-08-08 DIAGNOSIS — J449 Chronic obstructive pulmonary disease, unspecified: Secondary | ICD-10-CM | POA: Diagnosis not present

## 2019-08-09 DIAGNOSIS — I69091 Dysphagia following nontraumatic subarachnoid hemorrhage: Secondary | ICD-10-CM | POA: Diagnosis not present

## 2019-08-09 DIAGNOSIS — F0280 Dementia in other diseases classified elsewhere without behavioral disturbance: Secondary | ICD-10-CM | POA: Diagnosis not present

## 2019-08-09 DIAGNOSIS — R1312 Dysphagia, oropharyngeal phase: Secondary | ICD-10-CM | POA: Diagnosis not present

## 2019-08-09 DIAGNOSIS — R41841 Cognitive communication deficit: Secondary | ICD-10-CM | POA: Diagnosis not present

## 2019-08-09 DIAGNOSIS — J449 Chronic obstructive pulmonary disease, unspecified: Secondary | ICD-10-CM | POA: Diagnosis not present

## 2019-08-10 DIAGNOSIS — J449 Chronic obstructive pulmonary disease, unspecified: Secondary | ICD-10-CM | POA: Diagnosis not present

## 2019-08-10 DIAGNOSIS — R41841 Cognitive communication deficit: Secondary | ICD-10-CM | POA: Diagnosis not present

## 2019-08-10 DIAGNOSIS — I69091 Dysphagia following nontraumatic subarachnoid hemorrhage: Secondary | ICD-10-CM | POA: Diagnosis not present

## 2019-08-10 DIAGNOSIS — F0280 Dementia in other diseases classified elsewhere without behavioral disturbance: Secondary | ICD-10-CM | POA: Diagnosis not present

## 2019-08-10 DIAGNOSIS — R1312 Dysphagia, oropharyngeal phase: Secondary | ICD-10-CM | POA: Diagnosis not present

## 2019-08-13 DIAGNOSIS — F0280 Dementia in other diseases classified elsewhere without behavioral disturbance: Secondary | ICD-10-CM | POA: Diagnosis not present

## 2019-08-13 DIAGNOSIS — R1312 Dysphagia, oropharyngeal phase: Secondary | ICD-10-CM | POA: Diagnosis not present

## 2019-08-13 DIAGNOSIS — I69091 Dysphagia following nontraumatic subarachnoid hemorrhage: Secondary | ICD-10-CM | POA: Diagnosis not present

## 2019-08-13 DIAGNOSIS — J449 Chronic obstructive pulmonary disease, unspecified: Secondary | ICD-10-CM | POA: Diagnosis not present

## 2019-08-13 DIAGNOSIS — R41841 Cognitive communication deficit: Secondary | ICD-10-CM | POA: Diagnosis not present

## 2019-08-14 DIAGNOSIS — F0280 Dementia in other diseases classified elsewhere without behavioral disturbance: Secondary | ICD-10-CM | POA: Diagnosis not present

## 2019-08-14 DIAGNOSIS — R41841 Cognitive communication deficit: Secondary | ICD-10-CM | POA: Diagnosis not present

## 2019-08-14 DIAGNOSIS — I69091 Dysphagia following nontraumatic subarachnoid hemorrhage: Secondary | ICD-10-CM | POA: Diagnosis not present

## 2019-08-14 DIAGNOSIS — R1312 Dysphagia, oropharyngeal phase: Secondary | ICD-10-CM | POA: Diagnosis not present

## 2019-08-14 DIAGNOSIS — J449 Chronic obstructive pulmonary disease, unspecified: Secondary | ICD-10-CM | POA: Diagnosis not present

## 2019-08-15 DIAGNOSIS — I69091 Dysphagia following nontraumatic subarachnoid hemorrhage: Secondary | ICD-10-CM | POA: Diagnosis not present

## 2019-08-15 DIAGNOSIS — R41841 Cognitive communication deficit: Secondary | ICD-10-CM | POA: Diagnosis not present

## 2019-08-15 DIAGNOSIS — F0280 Dementia in other diseases classified elsewhere without behavioral disturbance: Secondary | ICD-10-CM | POA: Diagnosis not present

## 2019-08-15 DIAGNOSIS — R1312 Dysphagia, oropharyngeal phase: Secondary | ICD-10-CM | POA: Diagnosis not present

## 2019-08-15 DIAGNOSIS — J449 Chronic obstructive pulmonary disease, unspecified: Secondary | ICD-10-CM | POA: Diagnosis not present

## 2019-08-16 DIAGNOSIS — I69091 Dysphagia following nontraumatic subarachnoid hemorrhage: Secondary | ICD-10-CM | POA: Diagnosis not present

## 2019-08-16 DIAGNOSIS — F0280 Dementia in other diseases classified elsewhere without behavioral disturbance: Secondary | ICD-10-CM | POA: Diagnosis not present

## 2019-08-16 DIAGNOSIS — R41841 Cognitive communication deficit: Secondary | ICD-10-CM | POA: Diagnosis not present

## 2019-08-16 DIAGNOSIS — R1312 Dysphagia, oropharyngeal phase: Secondary | ICD-10-CM | POA: Diagnosis not present

## 2019-08-16 DIAGNOSIS — J449 Chronic obstructive pulmonary disease, unspecified: Secondary | ICD-10-CM | POA: Diagnosis not present

## 2019-08-20 DIAGNOSIS — F0280 Dementia in other diseases classified elsewhere without behavioral disturbance: Secondary | ICD-10-CM | POA: Diagnosis not present

## 2019-08-20 DIAGNOSIS — R1312 Dysphagia, oropharyngeal phase: Secondary | ICD-10-CM | POA: Diagnosis not present

## 2019-08-20 DIAGNOSIS — I69091 Dysphagia following nontraumatic subarachnoid hemorrhage: Secondary | ICD-10-CM | POA: Diagnosis not present

## 2019-08-20 DIAGNOSIS — J449 Chronic obstructive pulmonary disease, unspecified: Secondary | ICD-10-CM | POA: Diagnosis not present

## 2019-08-20 DIAGNOSIS — R41841 Cognitive communication deficit: Secondary | ICD-10-CM | POA: Diagnosis not present

## 2019-08-21 DIAGNOSIS — I10 Essential (primary) hypertension: Secondary | ICD-10-CM | POA: Diagnosis not present

## 2019-08-21 DIAGNOSIS — G309 Alzheimer's disease, unspecified: Secondary | ICD-10-CM | POA: Diagnosis not present

## 2019-08-21 DIAGNOSIS — J449 Chronic obstructive pulmonary disease, unspecified: Secondary | ICD-10-CM | POA: Diagnosis not present

## 2019-08-21 DIAGNOSIS — R531 Weakness: Secondary | ICD-10-CM | POA: Diagnosis not present

## 2019-08-22 DIAGNOSIS — J449 Chronic obstructive pulmonary disease, unspecified: Secondary | ICD-10-CM | POA: Diagnosis not present

## 2019-08-22 DIAGNOSIS — R41841 Cognitive communication deficit: Secondary | ICD-10-CM | POA: Diagnosis not present

## 2019-08-22 DIAGNOSIS — R1312 Dysphagia, oropharyngeal phase: Secondary | ICD-10-CM | POA: Diagnosis not present

## 2019-08-22 DIAGNOSIS — I69091 Dysphagia following nontraumatic subarachnoid hemorrhage: Secondary | ICD-10-CM | POA: Diagnosis not present

## 2019-08-22 DIAGNOSIS — F0280 Dementia in other diseases classified elsewhere without behavioral disturbance: Secondary | ICD-10-CM | POA: Diagnosis not present

## 2019-08-23 DIAGNOSIS — R1312 Dysphagia, oropharyngeal phase: Secondary | ICD-10-CM | POA: Diagnosis not present

## 2019-08-23 DIAGNOSIS — J449 Chronic obstructive pulmonary disease, unspecified: Secondary | ICD-10-CM | POA: Diagnosis not present

## 2019-08-23 DIAGNOSIS — I69091 Dysphagia following nontraumatic subarachnoid hemorrhage: Secondary | ICD-10-CM | POA: Diagnosis not present

## 2019-08-23 DIAGNOSIS — F0280 Dementia in other diseases classified elsewhere without behavioral disturbance: Secondary | ICD-10-CM | POA: Diagnosis not present

## 2019-08-23 DIAGNOSIS — R41841 Cognitive communication deficit: Secondary | ICD-10-CM | POA: Diagnosis not present

## 2019-08-24 DIAGNOSIS — R41841 Cognitive communication deficit: Secondary | ICD-10-CM | POA: Diagnosis not present

## 2019-08-24 DIAGNOSIS — I69091 Dysphagia following nontraumatic subarachnoid hemorrhage: Secondary | ICD-10-CM | POA: Diagnosis not present

## 2019-08-24 DIAGNOSIS — J449 Chronic obstructive pulmonary disease, unspecified: Secondary | ICD-10-CM | POA: Diagnosis not present

## 2019-08-24 DIAGNOSIS — R1312 Dysphagia, oropharyngeal phase: Secondary | ICD-10-CM | POA: Diagnosis not present

## 2019-08-24 DIAGNOSIS — F0280 Dementia in other diseases classified elsewhere without behavioral disturbance: Secondary | ICD-10-CM | POA: Diagnosis not present

## 2019-08-27 DIAGNOSIS — I69091 Dysphagia following nontraumatic subarachnoid hemorrhage: Secondary | ICD-10-CM | POA: Diagnosis not present

## 2019-08-27 DIAGNOSIS — R41841 Cognitive communication deficit: Secondary | ICD-10-CM | POA: Diagnosis not present

## 2019-08-27 DIAGNOSIS — R1312 Dysphagia, oropharyngeal phase: Secondary | ICD-10-CM | POA: Diagnosis not present

## 2019-08-27 DIAGNOSIS — F0280 Dementia in other diseases classified elsewhere without behavioral disturbance: Secondary | ICD-10-CM | POA: Diagnosis not present

## 2019-08-27 DIAGNOSIS — J449 Chronic obstructive pulmonary disease, unspecified: Secondary | ICD-10-CM | POA: Diagnosis not present

## 2019-08-28 DIAGNOSIS — R1312 Dysphagia, oropharyngeal phase: Secondary | ICD-10-CM | POA: Diagnosis not present

## 2019-08-28 DIAGNOSIS — I69091 Dysphagia following nontraumatic subarachnoid hemorrhage: Secondary | ICD-10-CM | POA: Diagnosis not present

## 2019-08-28 DIAGNOSIS — R41841 Cognitive communication deficit: Secondary | ICD-10-CM | POA: Diagnosis not present

## 2019-08-28 DIAGNOSIS — J449 Chronic obstructive pulmonary disease, unspecified: Secondary | ICD-10-CM | POA: Diagnosis not present

## 2019-08-28 DIAGNOSIS — F0280 Dementia in other diseases classified elsewhere without behavioral disturbance: Secondary | ICD-10-CM | POA: Diagnosis not present

## 2019-08-29 DIAGNOSIS — I69091 Dysphagia following nontraumatic subarachnoid hemorrhage: Secondary | ICD-10-CM | POA: Diagnosis not present

## 2019-08-29 DIAGNOSIS — F0280 Dementia in other diseases classified elsewhere without behavioral disturbance: Secondary | ICD-10-CM | POA: Diagnosis not present

## 2019-08-29 DIAGNOSIS — J449 Chronic obstructive pulmonary disease, unspecified: Secondary | ICD-10-CM | POA: Diagnosis not present

## 2019-08-29 DIAGNOSIS — R41841 Cognitive communication deficit: Secondary | ICD-10-CM | POA: Diagnosis not present

## 2019-08-29 DIAGNOSIS — R1312 Dysphagia, oropharyngeal phase: Secondary | ICD-10-CM | POA: Diagnosis not present

## 2019-08-31 DIAGNOSIS — I69091 Dysphagia following nontraumatic subarachnoid hemorrhage: Secondary | ICD-10-CM | POA: Diagnosis not present

## 2019-08-31 DIAGNOSIS — F0280 Dementia in other diseases classified elsewhere without behavioral disturbance: Secondary | ICD-10-CM | POA: Diagnosis not present

## 2019-08-31 DIAGNOSIS — J449 Chronic obstructive pulmonary disease, unspecified: Secondary | ICD-10-CM | POA: Diagnosis not present

## 2019-08-31 DIAGNOSIS — R1312 Dysphagia, oropharyngeal phase: Secondary | ICD-10-CM | POA: Diagnosis not present

## 2019-08-31 DIAGNOSIS — R41841 Cognitive communication deficit: Secondary | ICD-10-CM | POA: Diagnosis not present

## 2019-09-03 DIAGNOSIS — M6281 Muscle weakness (generalized): Secondary | ICD-10-CM | POA: Diagnosis not present

## 2019-09-03 DIAGNOSIS — I69091 Dysphagia following nontraumatic subarachnoid hemorrhage: Secondary | ICD-10-CM | POA: Diagnosis not present

## 2019-09-03 DIAGNOSIS — M25561 Pain in right knee: Secondary | ICD-10-CM | POA: Diagnosis not present

## 2019-09-03 DIAGNOSIS — R2681 Unsteadiness on feet: Secondary | ICD-10-CM | POA: Diagnosis not present

## 2019-09-03 DIAGNOSIS — G309 Alzheimer's disease, unspecified: Secondary | ICD-10-CM | POA: Diagnosis not present

## 2019-09-03 DIAGNOSIS — M25551 Pain in right hip: Secondary | ICD-10-CM | POA: Diagnosis not present

## 2019-09-03 DIAGNOSIS — R41841 Cognitive communication deficit: Secondary | ICD-10-CM | POA: Diagnosis not present

## 2019-09-03 DIAGNOSIS — F0280 Dementia in other diseases classified elsewhere without behavioral disturbance: Secondary | ICD-10-CM | POA: Diagnosis not present

## 2019-09-03 DIAGNOSIS — J449 Chronic obstructive pulmonary disease, unspecified: Secondary | ICD-10-CM | POA: Diagnosis not present

## 2019-09-03 DIAGNOSIS — R1312 Dysphagia, oropharyngeal phase: Secondary | ICD-10-CM | POA: Diagnosis not present

## 2019-09-04 DIAGNOSIS — M25551 Pain in right hip: Secondary | ICD-10-CM | POA: Diagnosis not present

## 2019-09-04 DIAGNOSIS — R1312 Dysphagia, oropharyngeal phase: Secondary | ICD-10-CM | POA: Diagnosis not present

## 2019-09-04 DIAGNOSIS — F0280 Dementia in other diseases classified elsewhere without behavioral disturbance: Secondary | ICD-10-CM | POA: Diagnosis not present

## 2019-09-04 DIAGNOSIS — G309 Alzheimer's disease, unspecified: Secondary | ICD-10-CM | POA: Diagnosis not present

## 2019-09-04 DIAGNOSIS — M25561 Pain in right knee: Secondary | ICD-10-CM | POA: Diagnosis not present

## 2019-09-04 DIAGNOSIS — J449 Chronic obstructive pulmonary disease, unspecified: Secondary | ICD-10-CM | POA: Diagnosis not present

## 2019-09-06 DIAGNOSIS — M25561 Pain in right knee: Secondary | ICD-10-CM | POA: Diagnosis not present

## 2019-09-06 DIAGNOSIS — J449 Chronic obstructive pulmonary disease, unspecified: Secondary | ICD-10-CM | POA: Diagnosis not present

## 2019-09-06 DIAGNOSIS — R1312 Dysphagia, oropharyngeal phase: Secondary | ICD-10-CM | POA: Diagnosis not present

## 2019-09-06 DIAGNOSIS — F0280 Dementia in other diseases classified elsewhere without behavioral disturbance: Secondary | ICD-10-CM | POA: Diagnosis not present

## 2019-09-06 DIAGNOSIS — M25551 Pain in right hip: Secondary | ICD-10-CM | POA: Diagnosis not present

## 2019-09-06 DIAGNOSIS — G309 Alzheimer's disease, unspecified: Secondary | ICD-10-CM | POA: Diagnosis not present

## 2019-09-07 DIAGNOSIS — J449 Chronic obstructive pulmonary disease, unspecified: Secondary | ICD-10-CM | POA: Diagnosis not present

## 2019-09-07 DIAGNOSIS — G309 Alzheimer's disease, unspecified: Secondary | ICD-10-CM | POA: Diagnosis not present

## 2019-09-07 DIAGNOSIS — M25561 Pain in right knee: Secondary | ICD-10-CM | POA: Diagnosis not present

## 2019-09-07 DIAGNOSIS — F0280 Dementia in other diseases classified elsewhere without behavioral disturbance: Secondary | ICD-10-CM | POA: Diagnosis not present

## 2019-09-07 DIAGNOSIS — M25551 Pain in right hip: Secondary | ICD-10-CM | POA: Diagnosis not present

## 2019-09-07 DIAGNOSIS — R1312 Dysphagia, oropharyngeal phase: Secondary | ICD-10-CM | POA: Diagnosis not present

## 2019-09-10 DIAGNOSIS — F0280 Dementia in other diseases classified elsewhere without behavioral disturbance: Secondary | ICD-10-CM | POA: Diagnosis not present

## 2019-09-10 DIAGNOSIS — G309 Alzheimer's disease, unspecified: Secondary | ICD-10-CM | POA: Diagnosis not present

## 2019-09-10 DIAGNOSIS — M25561 Pain in right knee: Secondary | ICD-10-CM | POA: Diagnosis not present

## 2019-09-10 DIAGNOSIS — M25551 Pain in right hip: Secondary | ICD-10-CM | POA: Diagnosis not present

## 2019-09-10 DIAGNOSIS — J449 Chronic obstructive pulmonary disease, unspecified: Secondary | ICD-10-CM | POA: Diagnosis not present

## 2019-09-10 DIAGNOSIS — R1312 Dysphagia, oropharyngeal phase: Secondary | ICD-10-CM | POA: Diagnosis not present

## 2019-09-11 DIAGNOSIS — R1312 Dysphagia, oropharyngeal phase: Secondary | ICD-10-CM | POA: Diagnosis not present

## 2019-09-11 DIAGNOSIS — F0280 Dementia in other diseases classified elsewhere without behavioral disturbance: Secondary | ICD-10-CM | POA: Diagnosis not present

## 2019-09-11 DIAGNOSIS — G309 Alzheimer's disease, unspecified: Secondary | ICD-10-CM | POA: Diagnosis not present

## 2019-09-11 DIAGNOSIS — M25551 Pain in right hip: Secondary | ICD-10-CM | POA: Diagnosis not present

## 2019-09-11 DIAGNOSIS — J449 Chronic obstructive pulmonary disease, unspecified: Secondary | ICD-10-CM | POA: Diagnosis not present

## 2019-09-11 DIAGNOSIS — M25561 Pain in right knee: Secondary | ICD-10-CM | POA: Diagnosis not present

## 2019-09-12 DIAGNOSIS — M25551 Pain in right hip: Secondary | ICD-10-CM | POA: Diagnosis not present

## 2019-09-12 DIAGNOSIS — J449 Chronic obstructive pulmonary disease, unspecified: Secondary | ICD-10-CM | POA: Diagnosis not present

## 2019-09-12 DIAGNOSIS — M25561 Pain in right knee: Secondary | ICD-10-CM | POA: Diagnosis not present

## 2019-09-12 DIAGNOSIS — R1312 Dysphagia, oropharyngeal phase: Secondary | ICD-10-CM | POA: Diagnosis not present

## 2019-09-12 DIAGNOSIS — G309 Alzheimer's disease, unspecified: Secondary | ICD-10-CM | POA: Diagnosis not present

## 2019-09-12 DIAGNOSIS — F0280 Dementia in other diseases classified elsewhere without behavioral disturbance: Secondary | ICD-10-CM | POA: Diagnosis not present

## 2019-09-13 DIAGNOSIS — R1312 Dysphagia, oropharyngeal phase: Secondary | ICD-10-CM | POA: Diagnosis not present

## 2019-09-13 DIAGNOSIS — J449 Chronic obstructive pulmonary disease, unspecified: Secondary | ICD-10-CM | POA: Diagnosis not present

## 2019-09-13 DIAGNOSIS — M25561 Pain in right knee: Secondary | ICD-10-CM | POA: Diagnosis not present

## 2019-09-13 DIAGNOSIS — G309 Alzheimer's disease, unspecified: Secondary | ICD-10-CM | POA: Diagnosis not present

## 2019-09-13 DIAGNOSIS — M25551 Pain in right hip: Secondary | ICD-10-CM | POA: Diagnosis not present

## 2019-09-13 DIAGNOSIS — F0280 Dementia in other diseases classified elsewhere without behavioral disturbance: Secondary | ICD-10-CM | POA: Diagnosis not present

## 2019-09-18 DIAGNOSIS — J449 Chronic obstructive pulmonary disease, unspecified: Secondary | ICD-10-CM | POA: Diagnosis not present

## 2019-09-18 DIAGNOSIS — M25561 Pain in right knee: Secondary | ICD-10-CM | POA: Diagnosis not present

## 2019-09-18 DIAGNOSIS — G309 Alzheimer's disease, unspecified: Secondary | ICD-10-CM | POA: Diagnosis not present

## 2019-09-18 DIAGNOSIS — F0280 Dementia in other diseases classified elsewhere without behavioral disturbance: Secondary | ICD-10-CM | POA: Diagnosis not present

## 2019-09-18 DIAGNOSIS — M25551 Pain in right hip: Secondary | ICD-10-CM | POA: Diagnosis not present

## 2019-09-18 DIAGNOSIS — R1312 Dysphagia, oropharyngeal phase: Secondary | ICD-10-CM | POA: Diagnosis not present

## 2019-09-19 DIAGNOSIS — R1312 Dysphagia, oropharyngeal phase: Secondary | ICD-10-CM | POA: Diagnosis not present

## 2019-09-19 DIAGNOSIS — M25561 Pain in right knee: Secondary | ICD-10-CM | POA: Diagnosis not present

## 2019-09-19 DIAGNOSIS — F0280 Dementia in other diseases classified elsewhere without behavioral disturbance: Secondary | ICD-10-CM | POA: Diagnosis not present

## 2019-09-19 DIAGNOSIS — J449 Chronic obstructive pulmonary disease, unspecified: Secondary | ICD-10-CM | POA: Diagnosis not present

## 2019-09-19 DIAGNOSIS — M25551 Pain in right hip: Secondary | ICD-10-CM | POA: Diagnosis not present

## 2019-09-19 DIAGNOSIS — G309 Alzheimer's disease, unspecified: Secondary | ICD-10-CM | POA: Diagnosis not present

## 2019-09-20 DIAGNOSIS — M25561 Pain in right knee: Secondary | ICD-10-CM | POA: Diagnosis not present

## 2019-09-20 DIAGNOSIS — M25551 Pain in right hip: Secondary | ICD-10-CM | POA: Diagnosis not present

## 2019-09-20 DIAGNOSIS — R1312 Dysphagia, oropharyngeal phase: Secondary | ICD-10-CM | POA: Diagnosis not present

## 2019-09-20 DIAGNOSIS — G309 Alzheimer's disease, unspecified: Secondary | ICD-10-CM | POA: Diagnosis not present

## 2019-09-20 DIAGNOSIS — F0280 Dementia in other diseases classified elsewhere without behavioral disturbance: Secondary | ICD-10-CM | POA: Diagnosis not present

## 2019-09-20 DIAGNOSIS — J449 Chronic obstructive pulmonary disease, unspecified: Secondary | ICD-10-CM | POA: Diagnosis not present

## 2019-09-21 DIAGNOSIS — F0280 Dementia in other diseases classified elsewhere without behavioral disturbance: Secondary | ICD-10-CM | POA: Diagnosis not present

## 2019-09-21 DIAGNOSIS — J449 Chronic obstructive pulmonary disease, unspecified: Secondary | ICD-10-CM | POA: Diagnosis not present

## 2019-09-21 DIAGNOSIS — R1312 Dysphagia, oropharyngeal phase: Secondary | ICD-10-CM | POA: Diagnosis not present

## 2019-09-21 DIAGNOSIS — M25551 Pain in right hip: Secondary | ICD-10-CM | POA: Diagnosis not present

## 2019-09-21 DIAGNOSIS — M25561 Pain in right knee: Secondary | ICD-10-CM | POA: Diagnosis not present

## 2019-09-21 DIAGNOSIS — G309 Alzheimer's disease, unspecified: Secondary | ICD-10-CM | POA: Diagnosis not present

## 2019-09-24 DIAGNOSIS — F0280 Dementia in other diseases classified elsewhere without behavioral disturbance: Secondary | ICD-10-CM | POA: Diagnosis not present

## 2019-09-24 DIAGNOSIS — G309 Alzheimer's disease, unspecified: Secondary | ICD-10-CM | POA: Diagnosis not present

## 2019-09-24 DIAGNOSIS — M25561 Pain in right knee: Secondary | ICD-10-CM | POA: Diagnosis not present

## 2019-09-24 DIAGNOSIS — M25551 Pain in right hip: Secondary | ICD-10-CM | POA: Diagnosis not present

## 2019-09-24 DIAGNOSIS — J449 Chronic obstructive pulmonary disease, unspecified: Secondary | ICD-10-CM | POA: Diagnosis not present

## 2019-09-24 DIAGNOSIS — R1312 Dysphagia, oropharyngeal phase: Secondary | ICD-10-CM | POA: Diagnosis not present

## 2019-09-25 DIAGNOSIS — M25551 Pain in right hip: Secondary | ICD-10-CM | POA: Diagnosis not present

## 2019-09-25 DIAGNOSIS — G309 Alzheimer's disease, unspecified: Secondary | ICD-10-CM | POA: Diagnosis not present

## 2019-09-25 DIAGNOSIS — F0280 Dementia in other diseases classified elsewhere without behavioral disturbance: Secondary | ICD-10-CM | POA: Diagnosis not present

## 2019-09-25 DIAGNOSIS — M25561 Pain in right knee: Secondary | ICD-10-CM | POA: Diagnosis not present

## 2019-09-25 DIAGNOSIS — R1312 Dysphagia, oropharyngeal phase: Secondary | ICD-10-CM | POA: Diagnosis not present

## 2019-09-25 DIAGNOSIS — J449 Chronic obstructive pulmonary disease, unspecified: Secondary | ICD-10-CM | POA: Diagnosis not present

## 2019-09-26 DIAGNOSIS — F0280 Dementia in other diseases classified elsewhere without behavioral disturbance: Secondary | ICD-10-CM | POA: Diagnosis not present

## 2019-09-26 DIAGNOSIS — G309 Alzheimer's disease, unspecified: Secondary | ICD-10-CM | POA: Diagnosis not present

## 2019-09-26 DIAGNOSIS — R1312 Dysphagia, oropharyngeal phase: Secondary | ICD-10-CM | POA: Diagnosis not present

## 2019-09-26 DIAGNOSIS — M25561 Pain in right knee: Secondary | ICD-10-CM | POA: Diagnosis not present

## 2019-09-26 DIAGNOSIS — M25551 Pain in right hip: Secondary | ICD-10-CM | POA: Diagnosis not present

## 2019-09-26 DIAGNOSIS — J449 Chronic obstructive pulmonary disease, unspecified: Secondary | ICD-10-CM | POA: Diagnosis not present

## 2019-09-28 DIAGNOSIS — J449 Chronic obstructive pulmonary disease, unspecified: Secondary | ICD-10-CM | POA: Diagnosis not present

## 2019-09-28 DIAGNOSIS — G309 Alzheimer's disease, unspecified: Secondary | ICD-10-CM | POA: Diagnosis not present

## 2019-09-28 DIAGNOSIS — M25561 Pain in right knee: Secondary | ICD-10-CM | POA: Diagnosis not present

## 2019-09-28 DIAGNOSIS — M25551 Pain in right hip: Secondary | ICD-10-CM | POA: Diagnosis not present

## 2019-09-28 DIAGNOSIS — F0280 Dementia in other diseases classified elsewhere without behavioral disturbance: Secondary | ICD-10-CM | POA: Diagnosis not present

## 2019-09-28 DIAGNOSIS — R1312 Dysphagia, oropharyngeal phase: Secondary | ICD-10-CM | POA: Diagnosis not present

## 2019-10-01 DIAGNOSIS — G309 Alzheimer's disease, unspecified: Secondary | ICD-10-CM | POA: Diagnosis not present

## 2019-10-01 DIAGNOSIS — M25551 Pain in right hip: Secondary | ICD-10-CM | POA: Diagnosis not present

## 2019-10-01 DIAGNOSIS — J449 Chronic obstructive pulmonary disease, unspecified: Secondary | ICD-10-CM | POA: Diagnosis not present

## 2019-10-01 DIAGNOSIS — M25561 Pain in right knee: Secondary | ICD-10-CM | POA: Diagnosis not present

## 2019-10-01 DIAGNOSIS — R1312 Dysphagia, oropharyngeal phase: Secondary | ICD-10-CM | POA: Diagnosis not present

## 2019-10-01 DIAGNOSIS — F0280 Dementia in other diseases classified elsewhere without behavioral disturbance: Secondary | ICD-10-CM | POA: Diagnosis not present

## 2019-10-02 DIAGNOSIS — H26493 Other secondary cataract, bilateral: Secondary | ICD-10-CM | POA: Diagnosis not present

## 2019-10-02 DIAGNOSIS — J449 Chronic obstructive pulmonary disease, unspecified: Secondary | ICD-10-CM | POA: Diagnosis not present

## 2019-10-02 DIAGNOSIS — R1312 Dysphagia, oropharyngeal phase: Secondary | ICD-10-CM | POA: Diagnosis not present

## 2019-10-02 DIAGNOSIS — F0280 Dementia in other diseases classified elsewhere without behavioral disturbance: Secondary | ICD-10-CM | POA: Diagnosis not present

## 2019-10-02 DIAGNOSIS — M25561 Pain in right knee: Secondary | ICD-10-CM | POA: Diagnosis not present

## 2019-10-02 DIAGNOSIS — Z961 Presence of intraocular lens: Secondary | ICD-10-CM | POA: Diagnosis not present

## 2019-10-02 DIAGNOSIS — G309 Alzheimer's disease, unspecified: Secondary | ICD-10-CM | POA: Diagnosis not present

## 2019-10-02 DIAGNOSIS — H43813 Vitreous degeneration, bilateral: Secondary | ICD-10-CM | POA: Diagnosis not present

## 2019-10-02 DIAGNOSIS — M25551 Pain in right hip: Secondary | ICD-10-CM | POA: Diagnosis not present

## 2019-10-04 DIAGNOSIS — M25551 Pain in right hip: Secondary | ICD-10-CM | POA: Diagnosis not present

## 2019-10-04 DIAGNOSIS — M25561 Pain in right knee: Secondary | ICD-10-CM | POA: Diagnosis not present

## 2019-10-04 DIAGNOSIS — J449 Chronic obstructive pulmonary disease, unspecified: Secondary | ICD-10-CM | POA: Diagnosis not present

## 2019-10-04 DIAGNOSIS — R41841 Cognitive communication deficit: Secondary | ICD-10-CM | POA: Diagnosis not present

## 2019-10-04 DIAGNOSIS — R2681 Unsteadiness on feet: Secondary | ICD-10-CM | POA: Diagnosis not present

## 2019-10-04 DIAGNOSIS — R1312 Dysphagia, oropharyngeal phase: Secondary | ICD-10-CM | POA: Diagnosis not present

## 2019-10-04 DIAGNOSIS — M6281 Muscle weakness (generalized): Secondary | ICD-10-CM | POA: Diagnosis not present

## 2019-10-04 DIAGNOSIS — F0280 Dementia in other diseases classified elsewhere without behavioral disturbance: Secondary | ICD-10-CM | POA: Diagnosis not present

## 2019-10-04 DIAGNOSIS — I69091 Dysphagia following nontraumatic subarachnoid hemorrhage: Secondary | ICD-10-CM | POA: Diagnosis not present

## 2019-10-04 DIAGNOSIS — G309 Alzheimer's disease, unspecified: Secondary | ICD-10-CM | POA: Diagnosis not present

## 2019-10-05 DIAGNOSIS — R1312 Dysphagia, oropharyngeal phase: Secondary | ICD-10-CM | POA: Diagnosis not present

## 2019-10-05 DIAGNOSIS — J449 Chronic obstructive pulmonary disease, unspecified: Secondary | ICD-10-CM | POA: Diagnosis not present

## 2019-10-05 DIAGNOSIS — M25551 Pain in right hip: Secondary | ICD-10-CM | POA: Diagnosis not present

## 2019-10-05 DIAGNOSIS — M25561 Pain in right knee: Secondary | ICD-10-CM | POA: Diagnosis not present

## 2019-10-05 DIAGNOSIS — G309 Alzheimer's disease, unspecified: Secondary | ICD-10-CM | POA: Diagnosis not present

## 2019-10-05 DIAGNOSIS — F0280 Dementia in other diseases classified elsewhere without behavioral disturbance: Secondary | ICD-10-CM | POA: Diagnosis not present

## 2019-10-08 DIAGNOSIS — G309 Alzheimer's disease, unspecified: Secondary | ICD-10-CM | POA: Diagnosis not present

## 2019-10-08 DIAGNOSIS — R1312 Dysphagia, oropharyngeal phase: Secondary | ICD-10-CM | POA: Diagnosis not present

## 2019-10-08 DIAGNOSIS — M25551 Pain in right hip: Secondary | ICD-10-CM | POA: Diagnosis not present

## 2019-10-08 DIAGNOSIS — J449 Chronic obstructive pulmonary disease, unspecified: Secondary | ICD-10-CM | POA: Diagnosis not present

## 2019-10-08 DIAGNOSIS — M25561 Pain in right knee: Secondary | ICD-10-CM | POA: Diagnosis not present

## 2019-10-08 DIAGNOSIS — F0280 Dementia in other diseases classified elsewhere without behavioral disturbance: Secondary | ICD-10-CM | POA: Diagnosis not present

## 2019-10-09 DIAGNOSIS — R1312 Dysphagia, oropharyngeal phase: Secondary | ICD-10-CM | POA: Diagnosis not present

## 2019-10-09 DIAGNOSIS — F0280 Dementia in other diseases classified elsewhere without behavioral disturbance: Secondary | ICD-10-CM | POA: Diagnosis not present

## 2019-10-09 DIAGNOSIS — J449 Chronic obstructive pulmonary disease, unspecified: Secondary | ICD-10-CM | POA: Diagnosis not present

## 2019-10-09 DIAGNOSIS — M25551 Pain in right hip: Secondary | ICD-10-CM | POA: Diagnosis not present

## 2019-10-09 DIAGNOSIS — M25561 Pain in right knee: Secondary | ICD-10-CM | POA: Diagnosis not present

## 2019-10-09 DIAGNOSIS — G309 Alzheimer's disease, unspecified: Secondary | ICD-10-CM | POA: Diagnosis not present

## 2019-10-10 DIAGNOSIS — F0280 Dementia in other diseases classified elsewhere without behavioral disturbance: Secondary | ICD-10-CM | POA: Diagnosis not present

## 2019-10-10 DIAGNOSIS — J449 Chronic obstructive pulmonary disease, unspecified: Secondary | ICD-10-CM | POA: Diagnosis not present

## 2019-10-10 DIAGNOSIS — G309 Alzheimer's disease, unspecified: Secondary | ICD-10-CM | POA: Diagnosis not present

## 2019-10-10 DIAGNOSIS — M25551 Pain in right hip: Secondary | ICD-10-CM | POA: Diagnosis not present

## 2019-10-10 DIAGNOSIS — R1312 Dysphagia, oropharyngeal phase: Secondary | ICD-10-CM | POA: Diagnosis not present

## 2019-10-10 DIAGNOSIS — M25561 Pain in right knee: Secondary | ICD-10-CM | POA: Diagnosis not present

## 2019-10-11 DIAGNOSIS — M25561 Pain in right knee: Secondary | ICD-10-CM | POA: Diagnosis not present

## 2019-10-11 DIAGNOSIS — R1312 Dysphagia, oropharyngeal phase: Secondary | ICD-10-CM | POA: Diagnosis not present

## 2019-10-11 DIAGNOSIS — J449 Chronic obstructive pulmonary disease, unspecified: Secondary | ICD-10-CM | POA: Diagnosis not present

## 2019-10-11 DIAGNOSIS — F0280 Dementia in other diseases classified elsewhere without behavioral disturbance: Secondary | ICD-10-CM | POA: Diagnosis not present

## 2019-10-11 DIAGNOSIS — I10 Essential (primary) hypertension: Secondary | ICD-10-CM | POA: Diagnosis not present

## 2019-10-11 DIAGNOSIS — G309 Alzheimer's disease, unspecified: Secondary | ICD-10-CM | POA: Diagnosis not present

## 2019-10-11 DIAGNOSIS — M25551 Pain in right hip: Secondary | ICD-10-CM | POA: Diagnosis not present

## 2019-10-11 DIAGNOSIS — R531 Weakness: Secondary | ICD-10-CM | POA: Diagnosis not present

## 2019-10-15 DIAGNOSIS — H04123 Dry eye syndrome of bilateral lacrimal glands: Secondary | ICD-10-CM | POA: Diagnosis not present

## 2019-10-15 DIAGNOSIS — J449 Chronic obstructive pulmonary disease, unspecified: Secondary | ICD-10-CM | POA: Diagnosis not present

## 2019-10-15 DIAGNOSIS — L97529 Non-pressure chronic ulcer of other part of left foot with unspecified severity: Secondary | ICD-10-CM | POA: Diagnosis not present

## 2019-10-15 DIAGNOSIS — R1312 Dysphagia, oropharyngeal phase: Secondary | ICD-10-CM | POA: Diagnosis not present

## 2019-10-15 DIAGNOSIS — F0280 Dementia in other diseases classified elsewhere without behavioral disturbance: Secondary | ICD-10-CM | POA: Diagnosis not present

## 2019-10-15 DIAGNOSIS — G309 Alzheimer's disease, unspecified: Secondary | ICD-10-CM | POA: Diagnosis not present

## 2019-10-15 DIAGNOSIS — M25561 Pain in right knee: Secondary | ICD-10-CM | POA: Diagnosis not present

## 2019-10-15 DIAGNOSIS — M25551 Pain in right hip: Secondary | ICD-10-CM | POA: Diagnosis not present

## 2019-10-16 DIAGNOSIS — M25551 Pain in right hip: Secondary | ICD-10-CM | POA: Diagnosis not present

## 2019-10-16 DIAGNOSIS — R1312 Dysphagia, oropharyngeal phase: Secondary | ICD-10-CM | POA: Diagnosis not present

## 2019-10-16 DIAGNOSIS — M25561 Pain in right knee: Secondary | ICD-10-CM | POA: Diagnosis not present

## 2019-10-16 DIAGNOSIS — J449 Chronic obstructive pulmonary disease, unspecified: Secondary | ICD-10-CM | POA: Diagnosis not present

## 2019-10-16 DIAGNOSIS — G309 Alzheimer's disease, unspecified: Secondary | ICD-10-CM | POA: Diagnosis not present

## 2019-10-16 DIAGNOSIS — F0280 Dementia in other diseases classified elsewhere without behavioral disturbance: Secondary | ICD-10-CM | POA: Diagnosis not present

## 2019-10-17 DIAGNOSIS — J449 Chronic obstructive pulmonary disease, unspecified: Secondary | ICD-10-CM | POA: Diagnosis not present

## 2019-10-17 DIAGNOSIS — R1312 Dysphagia, oropharyngeal phase: Secondary | ICD-10-CM | POA: Diagnosis not present

## 2019-10-17 DIAGNOSIS — F0280 Dementia in other diseases classified elsewhere without behavioral disturbance: Secondary | ICD-10-CM | POA: Diagnosis not present

## 2019-10-17 DIAGNOSIS — G309 Alzheimer's disease, unspecified: Secondary | ICD-10-CM | POA: Diagnosis not present

## 2019-10-17 DIAGNOSIS — M25561 Pain in right knee: Secondary | ICD-10-CM | POA: Diagnosis not present

## 2019-10-17 DIAGNOSIS — M25551 Pain in right hip: Secondary | ICD-10-CM | POA: Diagnosis not present

## 2019-10-19 DIAGNOSIS — J449 Chronic obstructive pulmonary disease, unspecified: Secondary | ICD-10-CM | POA: Diagnosis not present

## 2019-10-19 DIAGNOSIS — R1312 Dysphagia, oropharyngeal phase: Secondary | ICD-10-CM | POA: Diagnosis not present

## 2019-10-19 DIAGNOSIS — F0280 Dementia in other diseases classified elsewhere without behavioral disturbance: Secondary | ICD-10-CM | POA: Diagnosis not present

## 2019-10-19 DIAGNOSIS — M25561 Pain in right knee: Secondary | ICD-10-CM | POA: Diagnosis not present

## 2019-10-19 DIAGNOSIS — G309 Alzheimer's disease, unspecified: Secondary | ICD-10-CM | POA: Diagnosis not present

## 2019-10-19 DIAGNOSIS — M25551 Pain in right hip: Secondary | ICD-10-CM | POA: Diagnosis not present

## 2019-10-22 DIAGNOSIS — M25561 Pain in right knee: Secondary | ICD-10-CM | POA: Diagnosis not present

## 2019-10-22 DIAGNOSIS — J449 Chronic obstructive pulmonary disease, unspecified: Secondary | ICD-10-CM | POA: Diagnosis not present

## 2019-10-22 DIAGNOSIS — F0280 Dementia in other diseases classified elsewhere without behavioral disturbance: Secondary | ICD-10-CM | POA: Diagnosis not present

## 2019-10-22 DIAGNOSIS — G309 Alzheimer's disease, unspecified: Secondary | ICD-10-CM | POA: Diagnosis not present

## 2019-10-22 DIAGNOSIS — M25551 Pain in right hip: Secondary | ICD-10-CM | POA: Diagnosis not present

## 2019-10-22 DIAGNOSIS — R1312 Dysphagia, oropharyngeal phase: Secondary | ICD-10-CM | POA: Diagnosis not present

## 2019-10-23 DIAGNOSIS — J449 Chronic obstructive pulmonary disease, unspecified: Secondary | ICD-10-CM | POA: Diagnosis not present

## 2019-10-23 DIAGNOSIS — M25561 Pain in right knee: Secondary | ICD-10-CM | POA: Diagnosis not present

## 2019-10-23 DIAGNOSIS — R1312 Dysphagia, oropharyngeal phase: Secondary | ICD-10-CM | POA: Diagnosis not present

## 2019-10-23 DIAGNOSIS — F0280 Dementia in other diseases classified elsewhere without behavioral disturbance: Secondary | ICD-10-CM | POA: Diagnosis not present

## 2019-10-23 DIAGNOSIS — M25551 Pain in right hip: Secondary | ICD-10-CM | POA: Diagnosis not present

## 2019-10-23 DIAGNOSIS — G309 Alzheimer's disease, unspecified: Secondary | ICD-10-CM | POA: Diagnosis not present

## 2019-10-24 DIAGNOSIS — R1312 Dysphagia, oropharyngeal phase: Secondary | ICD-10-CM | POA: Diagnosis not present

## 2019-10-24 DIAGNOSIS — F0280 Dementia in other diseases classified elsewhere without behavioral disturbance: Secondary | ICD-10-CM | POA: Diagnosis not present

## 2019-10-24 DIAGNOSIS — G309 Alzheimer's disease, unspecified: Secondary | ICD-10-CM | POA: Diagnosis not present

## 2019-10-24 DIAGNOSIS — M25551 Pain in right hip: Secondary | ICD-10-CM | POA: Diagnosis not present

## 2019-10-24 DIAGNOSIS — M25561 Pain in right knee: Secondary | ICD-10-CM | POA: Diagnosis not present

## 2019-10-24 DIAGNOSIS — J449 Chronic obstructive pulmonary disease, unspecified: Secondary | ICD-10-CM | POA: Diagnosis not present

## 2019-10-25 DIAGNOSIS — J449 Chronic obstructive pulmonary disease, unspecified: Secondary | ICD-10-CM | POA: Diagnosis not present

## 2019-10-25 DIAGNOSIS — J9611 Chronic respiratory failure with hypoxia: Secondary | ICD-10-CM | POA: Diagnosis not present

## 2019-10-25 DIAGNOSIS — G301 Alzheimer's disease with late onset: Secondary | ICD-10-CM | POA: Diagnosis not present

## 2019-10-25 DIAGNOSIS — I10 Essential (primary) hypertension: Secondary | ICD-10-CM | POA: Diagnosis not present

## 2019-10-26 DIAGNOSIS — G309 Alzheimer's disease, unspecified: Secondary | ICD-10-CM | POA: Diagnosis not present

## 2019-10-26 DIAGNOSIS — R1312 Dysphagia, oropharyngeal phase: Secondary | ICD-10-CM | POA: Diagnosis not present

## 2019-10-26 DIAGNOSIS — F0280 Dementia in other diseases classified elsewhere without behavioral disturbance: Secondary | ICD-10-CM | POA: Diagnosis not present

## 2019-10-26 DIAGNOSIS — M25561 Pain in right knee: Secondary | ICD-10-CM | POA: Diagnosis not present

## 2019-10-26 DIAGNOSIS — M25551 Pain in right hip: Secondary | ICD-10-CM | POA: Diagnosis not present

## 2019-10-26 DIAGNOSIS — J449 Chronic obstructive pulmonary disease, unspecified: Secondary | ICD-10-CM | POA: Diagnosis not present

## 2019-10-30 DIAGNOSIS — G309 Alzheimer's disease, unspecified: Secondary | ICD-10-CM | POA: Diagnosis not present

## 2019-10-30 DIAGNOSIS — R1312 Dysphagia, oropharyngeal phase: Secondary | ICD-10-CM | POA: Diagnosis not present

## 2019-10-30 DIAGNOSIS — J449 Chronic obstructive pulmonary disease, unspecified: Secondary | ICD-10-CM | POA: Diagnosis not present

## 2019-10-30 DIAGNOSIS — F0280 Dementia in other diseases classified elsewhere without behavioral disturbance: Secondary | ICD-10-CM | POA: Diagnosis not present

## 2019-10-30 DIAGNOSIS — M25551 Pain in right hip: Secondary | ICD-10-CM | POA: Diagnosis not present

## 2019-10-30 DIAGNOSIS — M25561 Pain in right knee: Secondary | ICD-10-CM | POA: Diagnosis not present

## 2019-10-31 DIAGNOSIS — J449 Chronic obstructive pulmonary disease, unspecified: Secondary | ICD-10-CM | POA: Diagnosis not present

## 2019-10-31 DIAGNOSIS — R1312 Dysphagia, oropharyngeal phase: Secondary | ICD-10-CM | POA: Diagnosis not present

## 2019-10-31 DIAGNOSIS — G309 Alzheimer's disease, unspecified: Secondary | ICD-10-CM | POA: Diagnosis not present

## 2019-10-31 DIAGNOSIS — M25551 Pain in right hip: Secondary | ICD-10-CM | POA: Diagnosis not present

## 2019-10-31 DIAGNOSIS — M25561 Pain in right knee: Secondary | ICD-10-CM | POA: Diagnosis not present

## 2019-10-31 DIAGNOSIS — F0280 Dementia in other diseases classified elsewhere without behavioral disturbance: Secondary | ICD-10-CM | POA: Diagnosis not present

## 2019-11-02 DIAGNOSIS — G309 Alzheimer's disease, unspecified: Secondary | ICD-10-CM | POA: Diagnosis not present

## 2019-11-02 DIAGNOSIS — F0280 Dementia in other diseases classified elsewhere without behavioral disturbance: Secondary | ICD-10-CM | POA: Diagnosis not present

## 2019-11-02 DIAGNOSIS — J449 Chronic obstructive pulmonary disease, unspecified: Secondary | ICD-10-CM | POA: Diagnosis not present

## 2019-11-02 DIAGNOSIS — M25561 Pain in right knee: Secondary | ICD-10-CM | POA: Diagnosis not present

## 2019-11-02 DIAGNOSIS — M25551 Pain in right hip: Secondary | ICD-10-CM | POA: Diagnosis not present

## 2019-11-02 DIAGNOSIS — R1312 Dysphagia, oropharyngeal phase: Secondary | ICD-10-CM | POA: Diagnosis not present

## 2019-11-03 DIAGNOSIS — J449 Chronic obstructive pulmonary disease, unspecified: Secondary | ICD-10-CM | POA: Diagnosis not present

## 2019-11-03 DIAGNOSIS — M25561 Pain in right knee: Secondary | ICD-10-CM | POA: Diagnosis not present

## 2019-11-03 DIAGNOSIS — I69091 Dysphagia following nontraumatic subarachnoid hemorrhage: Secondary | ICD-10-CM | POA: Diagnosis not present

## 2019-11-03 DIAGNOSIS — G309 Alzheimer's disease, unspecified: Secondary | ICD-10-CM | POA: Diagnosis not present

## 2019-11-03 DIAGNOSIS — R41841 Cognitive communication deficit: Secondary | ICD-10-CM | POA: Diagnosis not present

## 2019-11-03 DIAGNOSIS — R1312 Dysphagia, oropharyngeal phase: Secondary | ICD-10-CM | POA: Diagnosis not present

## 2019-11-03 DIAGNOSIS — F0280 Dementia in other diseases classified elsewhere without behavioral disturbance: Secondary | ICD-10-CM | POA: Diagnosis not present

## 2019-11-03 DIAGNOSIS — M6281 Muscle weakness (generalized): Secondary | ICD-10-CM | POA: Diagnosis not present

## 2019-11-03 DIAGNOSIS — M25551 Pain in right hip: Secondary | ICD-10-CM | POA: Diagnosis not present

## 2019-11-03 DIAGNOSIS — R2681 Unsteadiness on feet: Secondary | ICD-10-CM | POA: Diagnosis not present

## 2019-11-06 DIAGNOSIS — F0280 Dementia in other diseases classified elsewhere without behavioral disturbance: Secondary | ICD-10-CM | POA: Diagnosis not present

## 2019-11-06 DIAGNOSIS — R1312 Dysphagia, oropharyngeal phase: Secondary | ICD-10-CM | POA: Diagnosis not present

## 2019-11-06 DIAGNOSIS — J449 Chronic obstructive pulmonary disease, unspecified: Secondary | ICD-10-CM | POA: Diagnosis not present

## 2019-11-06 DIAGNOSIS — G309 Alzheimer's disease, unspecified: Secondary | ICD-10-CM | POA: Diagnosis not present

## 2019-11-06 DIAGNOSIS — M25561 Pain in right knee: Secondary | ICD-10-CM | POA: Diagnosis not present

## 2019-11-06 DIAGNOSIS — M25551 Pain in right hip: Secondary | ICD-10-CM | POA: Diagnosis not present

## 2019-11-07 DIAGNOSIS — M25561 Pain in right knee: Secondary | ICD-10-CM | POA: Diagnosis not present

## 2019-11-07 DIAGNOSIS — G309 Alzheimer's disease, unspecified: Secondary | ICD-10-CM | POA: Diagnosis not present

## 2019-11-07 DIAGNOSIS — M25551 Pain in right hip: Secondary | ICD-10-CM | POA: Diagnosis not present

## 2019-11-07 DIAGNOSIS — J449 Chronic obstructive pulmonary disease, unspecified: Secondary | ICD-10-CM | POA: Diagnosis not present

## 2019-11-07 DIAGNOSIS — R1312 Dysphagia, oropharyngeal phase: Secondary | ICD-10-CM | POA: Diagnosis not present

## 2019-11-07 DIAGNOSIS — F0280 Dementia in other diseases classified elsewhere without behavioral disturbance: Secondary | ICD-10-CM | POA: Diagnosis not present

## 2019-11-08 DIAGNOSIS — L03032 Cellulitis of left toe: Secondary | ICD-10-CM | POA: Diagnosis not present

## 2019-11-08 DIAGNOSIS — G309 Alzheimer's disease, unspecified: Secondary | ICD-10-CM | POA: Diagnosis not present

## 2019-11-08 DIAGNOSIS — M79674 Pain in right toe(s): Secondary | ICD-10-CM | POA: Diagnosis not present

## 2019-11-08 DIAGNOSIS — B351 Tinea unguium: Secondary | ICD-10-CM | POA: Diagnosis not present

## 2019-11-08 DIAGNOSIS — L03031 Cellulitis of right toe: Secondary | ICD-10-CM | POA: Diagnosis not present

## 2019-11-08 DIAGNOSIS — M25561 Pain in right knee: Secondary | ICD-10-CM | POA: Diagnosis not present

## 2019-11-08 DIAGNOSIS — L600 Ingrowing nail: Secondary | ICD-10-CM | POA: Diagnosis not present

## 2019-11-08 DIAGNOSIS — J449 Chronic obstructive pulmonary disease, unspecified: Secondary | ICD-10-CM | POA: Diagnosis not present

## 2019-11-08 DIAGNOSIS — M79675 Pain in left toe(s): Secondary | ICD-10-CM | POA: Diagnosis not present

## 2019-11-08 DIAGNOSIS — L602 Onychogryphosis: Secondary | ICD-10-CM | POA: Diagnosis not present

## 2019-11-08 DIAGNOSIS — F0280 Dementia in other diseases classified elsewhere without behavioral disturbance: Secondary | ICD-10-CM | POA: Diagnosis not present

## 2019-11-08 DIAGNOSIS — R1312 Dysphagia, oropharyngeal phase: Secondary | ICD-10-CM | POA: Diagnosis not present

## 2019-11-08 DIAGNOSIS — M25551 Pain in right hip: Secondary | ICD-10-CM | POA: Diagnosis not present

## 2019-11-09 DIAGNOSIS — G309 Alzheimer's disease, unspecified: Secondary | ICD-10-CM | POA: Diagnosis not present

## 2019-11-09 DIAGNOSIS — R1312 Dysphagia, oropharyngeal phase: Secondary | ICD-10-CM | POA: Diagnosis not present

## 2019-11-09 DIAGNOSIS — M25551 Pain in right hip: Secondary | ICD-10-CM | POA: Diagnosis not present

## 2019-11-09 DIAGNOSIS — F0280 Dementia in other diseases classified elsewhere without behavioral disturbance: Secondary | ICD-10-CM | POA: Diagnosis not present

## 2019-11-09 DIAGNOSIS — M25561 Pain in right knee: Secondary | ICD-10-CM | POA: Diagnosis not present

## 2019-11-09 DIAGNOSIS — J449 Chronic obstructive pulmonary disease, unspecified: Secondary | ICD-10-CM | POA: Diagnosis not present

## 2019-11-12 DIAGNOSIS — G309 Alzheimer's disease, unspecified: Secondary | ICD-10-CM | POA: Diagnosis not present

## 2019-11-12 DIAGNOSIS — J449 Chronic obstructive pulmonary disease, unspecified: Secondary | ICD-10-CM | POA: Diagnosis not present

## 2019-11-12 DIAGNOSIS — M25551 Pain in right hip: Secondary | ICD-10-CM | POA: Diagnosis not present

## 2019-11-12 DIAGNOSIS — F0280 Dementia in other diseases classified elsewhere without behavioral disturbance: Secondary | ICD-10-CM | POA: Diagnosis not present

## 2019-11-12 DIAGNOSIS — R1312 Dysphagia, oropharyngeal phase: Secondary | ICD-10-CM | POA: Diagnosis not present

## 2019-11-12 DIAGNOSIS — M25561 Pain in right knee: Secondary | ICD-10-CM | POA: Diagnosis not present

## 2019-11-13 DIAGNOSIS — F0280 Dementia in other diseases classified elsewhere without behavioral disturbance: Secondary | ICD-10-CM | POA: Diagnosis not present

## 2019-11-13 DIAGNOSIS — M25561 Pain in right knee: Secondary | ICD-10-CM | POA: Diagnosis not present

## 2019-11-13 DIAGNOSIS — R1312 Dysphagia, oropharyngeal phase: Secondary | ICD-10-CM | POA: Diagnosis not present

## 2019-11-13 DIAGNOSIS — G309 Alzheimer's disease, unspecified: Secondary | ICD-10-CM | POA: Diagnosis not present

## 2019-11-13 DIAGNOSIS — M25551 Pain in right hip: Secondary | ICD-10-CM | POA: Diagnosis not present

## 2019-11-13 DIAGNOSIS — J449 Chronic obstructive pulmonary disease, unspecified: Secondary | ICD-10-CM | POA: Diagnosis not present

## 2019-11-15 DIAGNOSIS — M25551 Pain in right hip: Secondary | ICD-10-CM | POA: Diagnosis not present

## 2019-11-15 DIAGNOSIS — J449 Chronic obstructive pulmonary disease, unspecified: Secondary | ICD-10-CM | POA: Diagnosis not present

## 2019-11-15 DIAGNOSIS — R1312 Dysphagia, oropharyngeal phase: Secondary | ICD-10-CM | POA: Diagnosis not present

## 2019-11-15 DIAGNOSIS — M25561 Pain in right knee: Secondary | ICD-10-CM | POA: Diagnosis not present

## 2019-11-15 DIAGNOSIS — F0280 Dementia in other diseases classified elsewhere without behavioral disturbance: Secondary | ICD-10-CM | POA: Diagnosis not present

## 2019-11-15 DIAGNOSIS — G309 Alzheimer's disease, unspecified: Secondary | ICD-10-CM | POA: Diagnosis not present

## 2019-11-16 DIAGNOSIS — J449 Chronic obstructive pulmonary disease, unspecified: Secondary | ICD-10-CM | POA: Diagnosis not present

## 2019-11-16 DIAGNOSIS — M25551 Pain in right hip: Secondary | ICD-10-CM | POA: Diagnosis not present

## 2019-11-16 DIAGNOSIS — M25561 Pain in right knee: Secondary | ICD-10-CM | POA: Diagnosis not present

## 2019-11-16 DIAGNOSIS — F0280 Dementia in other diseases classified elsewhere without behavioral disturbance: Secondary | ICD-10-CM | POA: Diagnosis not present

## 2019-11-16 DIAGNOSIS — R1312 Dysphagia, oropharyngeal phase: Secondary | ICD-10-CM | POA: Diagnosis not present

## 2019-11-16 DIAGNOSIS — G309 Alzheimer's disease, unspecified: Secondary | ICD-10-CM | POA: Diagnosis not present

## 2019-11-19 DIAGNOSIS — J449 Chronic obstructive pulmonary disease, unspecified: Secondary | ICD-10-CM | POA: Diagnosis not present

## 2019-11-19 DIAGNOSIS — F0280 Dementia in other diseases classified elsewhere without behavioral disturbance: Secondary | ICD-10-CM | POA: Diagnosis not present

## 2019-11-19 DIAGNOSIS — R1312 Dysphagia, oropharyngeal phase: Secondary | ICD-10-CM | POA: Diagnosis not present

## 2019-11-19 DIAGNOSIS — G309 Alzheimer's disease, unspecified: Secondary | ICD-10-CM | POA: Diagnosis not present

## 2019-11-19 DIAGNOSIS — M25551 Pain in right hip: Secondary | ICD-10-CM | POA: Diagnosis not present

## 2019-11-19 DIAGNOSIS — M25561 Pain in right knee: Secondary | ICD-10-CM | POA: Diagnosis not present

## 2019-11-20 DIAGNOSIS — M25561 Pain in right knee: Secondary | ICD-10-CM | POA: Diagnosis not present

## 2019-11-20 DIAGNOSIS — G309 Alzheimer's disease, unspecified: Secondary | ICD-10-CM | POA: Diagnosis not present

## 2019-11-20 DIAGNOSIS — F0280 Dementia in other diseases classified elsewhere without behavioral disturbance: Secondary | ICD-10-CM | POA: Diagnosis not present

## 2019-11-20 DIAGNOSIS — J449 Chronic obstructive pulmonary disease, unspecified: Secondary | ICD-10-CM | POA: Diagnosis not present

## 2019-11-20 DIAGNOSIS — M25551 Pain in right hip: Secondary | ICD-10-CM | POA: Diagnosis not present

## 2019-11-20 DIAGNOSIS — R1312 Dysphagia, oropharyngeal phase: Secondary | ICD-10-CM | POA: Diagnosis not present

## 2019-11-22 DIAGNOSIS — F0280 Dementia in other diseases classified elsewhere without behavioral disturbance: Secondary | ICD-10-CM | POA: Diagnosis not present

## 2019-11-22 DIAGNOSIS — M25551 Pain in right hip: Secondary | ICD-10-CM | POA: Diagnosis not present

## 2019-11-22 DIAGNOSIS — R1312 Dysphagia, oropharyngeal phase: Secondary | ICD-10-CM | POA: Diagnosis not present

## 2019-11-22 DIAGNOSIS — M25561 Pain in right knee: Secondary | ICD-10-CM | POA: Diagnosis not present

## 2019-11-22 DIAGNOSIS — G309 Alzheimer's disease, unspecified: Secondary | ICD-10-CM | POA: Diagnosis not present

## 2019-11-22 DIAGNOSIS — J449 Chronic obstructive pulmonary disease, unspecified: Secondary | ICD-10-CM | POA: Diagnosis not present

## 2019-11-23 DIAGNOSIS — G309 Alzheimer's disease, unspecified: Secondary | ICD-10-CM | POA: Diagnosis not present

## 2019-11-23 DIAGNOSIS — J449 Chronic obstructive pulmonary disease, unspecified: Secondary | ICD-10-CM | POA: Diagnosis not present

## 2019-11-23 DIAGNOSIS — M25561 Pain in right knee: Secondary | ICD-10-CM | POA: Diagnosis not present

## 2019-11-23 DIAGNOSIS — F0280 Dementia in other diseases classified elsewhere without behavioral disturbance: Secondary | ICD-10-CM | POA: Diagnosis not present

## 2019-11-23 DIAGNOSIS — M25551 Pain in right hip: Secondary | ICD-10-CM | POA: Diagnosis not present

## 2019-11-23 DIAGNOSIS — R1312 Dysphagia, oropharyngeal phase: Secondary | ICD-10-CM | POA: Diagnosis not present

## 2019-11-27 DIAGNOSIS — F0280 Dementia in other diseases classified elsewhere without behavioral disturbance: Secondary | ICD-10-CM | POA: Diagnosis not present

## 2019-11-27 DIAGNOSIS — M25551 Pain in right hip: Secondary | ICD-10-CM | POA: Diagnosis not present

## 2019-11-27 DIAGNOSIS — M25561 Pain in right knee: Secondary | ICD-10-CM | POA: Diagnosis not present

## 2019-11-27 DIAGNOSIS — R1312 Dysphagia, oropharyngeal phase: Secondary | ICD-10-CM | POA: Diagnosis not present

## 2019-11-27 DIAGNOSIS — G309 Alzheimer's disease, unspecified: Secondary | ICD-10-CM | POA: Diagnosis not present

## 2019-11-27 DIAGNOSIS — J449 Chronic obstructive pulmonary disease, unspecified: Secondary | ICD-10-CM | POA: Diagnosis not present

## 2019-11-28 DIAGNOSIS — G309 Alzheimer's disease, unspecified: Secondary | ICD-10-CM | POA: Diagnosis not present

## 2019-11-28 DIAGNOSIS — J449 Chronic obstructive pulmonary disease, unspecified: Secondary | ICD-10-CM | POA: Diagnosis not present

## 2019-11-28 DIAGNOSIS — R1312 Dysphagia, oropharyngeal phase: Secondary | ICD-10-CM | POA: Diagnosis not present

## 2019-11-28 DIAGNOSIS — F5101 Primary insomnia: Secondary | ICD-10-CM | POA: Diagnosis not present

## 2019-11-28 DIAGNOSIS — F0280 Dementia in other diseases classified elsewhere without behavioral disturbance: Secondary | ICD-10-CM | POA: Diagnosis not present

## 2019-11-28 DIAGNOSIS — M25551 Pain in right hip: Secondary | ICD-10-CM | POA: Diagnosis not present

## 2019-11-28 DIAGNOSIS — F3289 Other specified depressive episodes: Secondary | ICD-10-CM | POA: Diagnosis not present

## 2019-11-28 DIAGNOSIS — M25561 Pain in right knee: Secondary | ICD-10-CM | POA: Diagnosis not present

## 2019-11-28 DIAGNOSIS — F0391 Unspecified dementia with behavioral disturbance: Secondary | ICD-10-CM | POA: Diagnosis not present

## 2019-11-29 DIAGNOSIS — G309 Alzheimer's disease, unspecified: Secondary | ICD-10-CM | POA: Diagnosis not present

## 2019-11-29 DIAGNOSIS — M25551 Pain in right hip: Secondary | ICD-10-CM | POA: Diagnosis not present

## 2019-11-29 DIAGNOSIS — J449 Chronic obstructive pulmonary disease, unspecified: Secondary | ICD-10-CM | POA: Diagnosis not present

## 2019-11-29 DIAGNOSIS — R1312 Dysphagia, oropharyngeal phase: Secondary | ICD-10-CM | POA: Diagnosis not present

## 2019-11-29 DIAGNOSIS — M25561 Pain in right knee: Secondary | ICD-10-CM | POA: Diagnosis not present

## 2019-11-29 DIAGNOSIS — F0280 Dementia in other diseases classified elsewhere without behavioral disturbance: Secondary | ICD-10-CM | POA: Diagnosis not present

## 2019-11-30 DIAGNOSIS — M25551 Pain in right hip: Secondary | ICD-10-CM | POA: Diagnosis not present

## 2019-11-30 DIAGNOSIS — M25561 Pain in right knee: Secondary | ICD-10-CM | POA: Diagnosis not present

## 2019-11-30 DIAGNOSIS — J449 Chronic obstructive pulmonary disease, unspecified: Secondary | ICD-10-CM | POA: Diagnosis not present

## 2019-11-30 DIAGNOSIS — F0280 Dementia in other diseases classified elsewhere without behavioral disturbance: Secondary | ICD-10-CM | POA: Diagnosis not present

## 2019-11-30 DIAGNOSIS — R1312 Dysphagia, oropharyngeal phase: Secondary | ICD-10-CM | POA: Diagnosis not present

## 2019-11-30 DIAGNOSIS — G309 Alzheimer's disease, unspecified: Secondary | ICD-10-CM | POA: Diagnosis not present

## 2019-12-02 DIAGNOSIS — M25561 Pain in right knee: Secondary | ICD-10-CM | POA: Diagnosis not present

## 2019-12-02 DIAGNOSIS — M25551 Pain in right hip: Secondary | ICD-10-CM | POA: Diagnosis not present

## 2019-12-02 DIAGNOSIS — F0280 Dementia in other diseases classified elsewhere without behavioral disturbance: Secondary | ICD-10-CM | POA: Diagnosis not present

## 2019-12-02 DIAGNOSIS — G309 Alzheimer's disease, unspecified: Secondary | ICD-10-CM | POA: Diagnosis not present

## 2019-12-02 DIAGNOSIS — R1312 Dysphagia, oropharyngeal phase: Secondary | ICD-10-CM | POA: Diagnosis not present

## 2019-12-02 DIAGNOSIS — J449 Chronic obstructive pulmonary disease, unspecified: Secondary | ICD-10-CM | POA: Diagnosis not present

## 2019-12-03 DIAGNOSIS — J449 Chronic obstructive pulmonary disease, unspecified: Secondary | ICD-10-CM | POA: Diagnosis not present

## 2019-12-03 DIAGNOSIS — F0280 Dementia in other diseases classified elsewhere without behavioral disturbance: Secondary | ICD-10-CM | POA: Diagnosis not present

## 2019-12-03 DIAGNOSIS — G309 Alzheimer's disease, unspecified: Secondary | ICD-10-CM | POA: Diagnosis not present

## 2019-12-03 DIAGNOSIS — R1312 Dysphagia, oropharyngeal phase: Secondary | ICD-10-CM | POA: Diagnosis not present

## 2019-12-03 DIAGNOSIS — M25551 Pain in right hip: Secondary | ICD-10-CM | POA: Diagnosis not present

## 2019-12-03 DIAGNOSIS — M25561 Pain in right knee: Secondary | ICD-10-CM | POA: Diagnosis not present

## 2020-01-29 DIAGNOSIS — R609 Edema, unspecified: Secondary | ICD-10-CM | POA: Diagnosis not present

## 2020-02-01 DIAGNOSIS — M25512 Pain in left shoulder: Secondary | ICD-10-CM | POA: Diagnosis not present

## 2020-02-01 DIAGNOSIS — W19XXXA Unspecified fall, initial encounter: Secondary | ICD-10-CM | POA: Diagnosis not present

## 2020-02-01 DIAGNOSIS — M25552 Pain in left hip: Secondary | ICD-10-CM | POA: Diagnosis not present

## 2020-02-01 DIAGNOSIS — M8580 Other specified disorders of bone density and structure, unspecified site: Secondary | ICD-10-CM | POA: Diagnosis not present

## 2020-02-01 DIAGNOSIS — M19011 Primary osteoarthritis, right shoulder: Secondary | ICD-10-CM | POA: Diagnosis not present

## 2020-02-01 DIAGNOSIS — M25561 Pain in right knee: Secondary | ICD-10-CM | POA: Diagnosis not present

## 2020-02-01 DIAGNOSIS — M79644 Pain in right finger(s): Secondary | ICD-10-CM | POA: Diagnosis not present

## 2020-02-04 DIAGNOSIS — M25512 Pain in left shoulder: Secondary | ICD-10-CM | POA: Diagnosis not present

## 2020-02-04 DIAGNOSIS — R262 Difficulty in walking, not elsewhere classified: Secondary | ICD-10-CM | POA: Diagnosis not present

## 2020-02-14 DIAGNOSIS — J449 Chronic obstructive pulmonary disease, unspecified: Secondary | ICD-10-CM | POA: Diagnosis not present

## 2020-02-14 DIAGNOSIS — F5101 Primary insomnia: Secondary | ICD-10-CM | POA: Diagnosis not present

## 2020-02-14 DIAGNOSIS — R531 Weakness: Secondary | ICD-10-CM | POA: Diagnosis not present

## 2020-02-14 DIAGNOSIS — I10 Essential (primary) hypertension: Secondary | ICD-10-CM | POA: Diagnosis not present

## 2020-02-14 DIAGNOSIS — F411 Generalized anxiety disorder: Secondary | ICD-10-CM | POA: Diagnosis not present

## 2020-02-20 DIAGNOSIS — F0391 Unspecified dementia with behavioral disturbance: Secondary | ICD-10-CM | POA: Diagnosis not present

## 2020-02-20 DIAGNOSIS — F411 Generalized anxiety disorder: Secondary | ICD-10-CM | POA: Diagnosis not present

## 2020-02-20 DIAGNOSIS — F5101 Primary insomnia: Secondary | ICD-10-CM | POA: Diagnosis not present

## 2020-02-26 DIAGNOSIS — R262 Difficulty in walking, not elsewhere classified: Secondary | ICD-10-CM | POA: Diagnosis not present

## 2020-02-26 DIAGNOSIS — R531 Weakness: Secondary | ICD-10-CM | POA: Diagnosis not present

## 2020-02-26 DIAGNOSIS — S52501A Unspecified fracture of the lower end of right radius, initial encounter for closed fracture: Secondary | ICD-10-CM | POA: Diagnosis not present

## 2020-02-26 DIAGNOSIS — G301 Alzheimer's disease with late onset: Secondary | ICD-10-CM | POA: Diagnosis not present

## 2020-02-26 DIAGNOSIS — M6281 Muscle weakness (generalized): Secondary | ICD-10-CM | POA: Diagnosis not present

## 2020-02-26 DIAGNOSIS — R278 Other lack of coordination: Secondary | ICD-10-CM | POA: Diagnosis not present

## 2020-02-26 DIAGNOSIS — F0391 Unspecified dementia with behavioral disturbance: Secondary | ICD-10-CM | POA: Diagnosis not present

## 2020-02-26 DIAGNOSIS — S42214S Unspecified nondisplaced fracture of surgical neck of right humerus, sequela: Secondary | ICD-10-CM | POA: Diagnosis not present

## 2020-02-26 DIAGNOSIS — F3289 Other specified depressive episodes: Secondary | ICD-10-CM | POA: Diagnosis not present

## 2020-02-26 DIAGNOSIS — R1312 Dysphagia, oropharyngeal phase: Secondary | ICD-10-CM | POA: Diagnosis not present

## 2020-02-26 DIAGNOSIS — R0600 Dyspnea, unspecified: Secondary | ICD-10-CM | POA: Diagnosis not present

## 2020-02-26 DIAGNOSIS — L602 Onychogryphosis: Secondary | ICD-10-CM | POA: Diagnosis not present

## 2020-02-26 DIAGNOSIS — F5101 Primary insomnia: Secondary | ICD-10-CM | POA: Diagnosis not present

## 2020-02-26 DIAGNOSIS — G309 Alzheimer's disease, unspecified: Secondary | ICD-10-CM | POA: Diagnosis not present

## 2020-02-26 DIAGNOSIS — J42 Unspecified chronic bronchitis: Secondary | ICD-10-CM | POA: Diagnosis not present

## 2020-02-26 DIAGNOSIS — B351 Tinea unguium: Secondary | ICD-10-CM | POA: Diagnosis not present

## 2020-02-26 DIAGNOSIS — W1830XA Fall on same level, unspecified, initial encounter: Secondary | ICD-10-CM | POA: Diagnosis not present

## 2020-02-26 DIAGNOSIS — J449 Chronic obstructive pulmonary disease, unspecified: Secondary | ICD-10-CM | POA: Diagnosis not present

## 2020-02-26 DIAGNOSIS — M8949 Other hypertrophic osteoarthropathy, multiple sites: Secondary | ICD-10-CM | POA: Diagnosis not present

## 2020-02-26 DIAGNOSIS — F0281 Dementia in other diseases classified elsewhere with behavioral disturbance: Secondary | ICD-10-CM | POA: Diagnosis not present

## 2020-02-26 DIAGNOSIS — J45909 Unspecified asthma, uncomplicated: Secondary | ICD-10-CM | POA: Diagnosis not present

## 2020-02-26 DIAGNOSIS — Z961 Presence of intraocular lens: Secondary | ICD-10-CM | POA: Diagnosis not present

## 2020-02-26 DIAGNOSIS — H04123 Dry eye syndrome of bilateral lacrimal glands: Secondary | ICD-10-CM | POA: Diagnosis not present

## 2020-02-26 DIAGNOSIS — W19XXXA Unspecified fall, initial encounter: Secondary | ICD-10-CM | POA: Diagnosis not present

## 2020-02-26 DIAGNOSIS — G8921 Chronic pain due to trauma: Secondary | ICD-10-CM | POA: Diagnosis not present

## 2020-02-26 DIAGNOSIS — R41841 Cognitive communication deficit: Secondary | ICD-10-CM | POA: Diagnosis not present

## 2020-02-26 DIAGNOSIS — M79674 Pain in right toe(s): Secondary | ICD-10-CM | POA: Diagnosis not present

## 2020-02-26 DIAGNOSIS — H43813 Vitreous degeneration, bilateral: Secondary | ICD-10-CM | POA: Diagnosis not present

## 2020-02-26 DIAGNOSIS — L600 Ingrowing nail: Secondary | ICD-10-CM | POA: Diagnosis not present

## 2020-02-26 DIAGNOSIS — L539 Erythematous condition, unspecified: Secondary | ICD-10-CM | POA: Diagnosis not present

## 2020-02-26 DIAGNOSIS — H26493 Other secondary cataract, bilateral: Secondary | ICD-10-CM | POA: Diagnosis not present

## 2020-02-26 DIAGNOSIS — F411 Generalized anxiety disorder: Secondary | ICD-10-CM | POA: Diagnosis not present

## 2020-02-26 DIAGNOSIS — I82501 Chronic embolism and thrombosis of unspecified deep veins of right lower extremity: Secondary | ICD-10-CM | POA: Diagnosis not present

## 2020-02-26 DIAGNOSIS — S42214A Unspecified nondisplaced fracture of surgical neck of right humerus, initial encounter for closed fracture: Secondary | ICD-10-CM | POA: Diagnosis not present

## 2020-02-26 DIAGNOSIS — R059 Cough, unspecified: Secondary | ICD-10-CM | POA: Diagnosis not present

## 2020-02-26 DIAGNOSIS — J9611 Chronic respiratory failure with hypoxia: Secondary | ICD-10-CM | POA: Diagnosis not present

## 2020-02-26 DIAGNOSIS — E2681 Bartter's syndrome: Secondary | ICD-10-CM | POA: Diagnosis not present

## 2020-02-26 DIAGNOSIS — M25561 Pain in right knee: Secondary | ICD-10-CM | POA: Diagnosis not present

## 2020-02-26 DIAGNOSIS — R2681 Unsteadiness on feet: Secondary | ICD-10-CM | POA: Diagnosis not present

## 2020-02-26 DIAGNOSIS — J188 Other pneumonia, unspecified organism: Secondary | ICD-10-CM | POA: Diagnosis not present

## 2020-02-26 DIAGNOSIS — S42291A Other displaced fracture of upper end of right humerus, initial encounter for closed fracture: Secondary | ICD-10-CM | POA: Diagnosis not present

## 2020-02-26 DIAGNOSIS — M79675 Pain in left toe(s): Secondary | ICD-10-CM | POA: Diagnosis not present

## 2020-02-26 DIAGNOSIS — M25551 Pain in right hip: Secondary | ICD-10-CM | POA: Diagnosis not present

## 2020-02-26 DIAGNOSIS — S52531A Colles' fracture of right radius, initial encounter for closed fracture: Secondary | ICD-10-CM | POA: Diagnosis not present

## 2020-02-26 DIAGNOSIS — I10 Essential (primary) hypertension: Secondary | ICD-10-CM | POA: Diagnosis not present

## 2020-03-03 DIAGNOSIS — R262 Difficulty in walking, not elsewhere classified: Secondary | ICD-10-CM | POA: Diagnosis not present

## 2020-03-11 DIAGNOSIS — S42291A Other displaced fracture of upper end of right humerus, initial encounter for closed fracture: Secondary | ICD-10-CM | POA: Diagnosis not present

## 2020-03-11 DIAGNOSIS — S52531A Colles' fracture of right radius, initial encounter for closed fracture: Secondary | ICD-10-CM | POA: Diagnosis not present

## 2020-03-12 DIAGNOSIS — F3289 Other specified depressive episodes: Secondary | ICD-10-CM | POA: Diagnosis not present

## 2020-03-12 DIAGNOSIS — F0391 Unspecified dementia with behavioral disturbance: Secondary | ICD-10-CM | POA: Diagnosis not present

## 2020-03-12 DIAGNOSIS — F411 Generalized anxiety disorder: Secondary | ICD-10-CM | POA: Diagnosis not present

## 2020-03-12 DIAGNOSIS — F5101 Primary insomnia: Secondary | ICD-10-CM | POA: Diagnosis not present

## 2020-03-25 DIAGNOSIS — J449 Chronic obstructive pulmonary disease, unspecified: Secondary | ICD-10-CM | POA: Diagnosis not present

## 2020-03-25 DIAGNOSIS — F0391 Unspecified dementia with behavioral disturbance: Secondary | ICD-10-CM | POA: Diagnosis not present

## 2020-03-25 DIAGNOSIS — I10 Essential (primary) hypertension: Secondary | ICD-10-CM | POA: Diagnosis not present

## 2020-03-25 DIAGNOSIS — M8949 Other hypertrophic osteoarthropathy, multiple sites: Secondary | ICD-10-CM | POA: Diagnosis not present

## 2020-03-25 DIAGNOSIS — F411 Generalized anxiety disorder: Secondary | ICD-10-CM | POA: Diagnosis not present

## 2020-04-01 DIAGNOSIS — M8949 Other hypertrophic osteoarthropathy, multiple sites: Secondary | ICD-10-CM | POA: Diagnosis not present

## 2020-04-01 DIAGNOSIS — J449 Chronic obstructive pulmonary disease, unspecified: Secondary | ICD-10-CM | POA: Diagnosis not present

## 2020-04-01 DIAGNOSIS — I10 Essential (primary) hypertension: Secondary | ICD-10-CM | POA: Diagnosis not present

## 2020-04-01 DIAGNOSIS — R531 Weakness: Secondary | ICD-10-CM | POA: Diagnosis not present

## 2020-04-01 DIAGNOSIS — F411 Generalized anxiety disorder: Secondary | ICD-10-CM | POA: Diagnosis not present

## 2020-04-08 DIAGNOSIS — J449 Chronic obstructive pulmonary disease, unspecified: Secondary | ICD-10-CM | POA: Diagnosis not present

## 2020-04-08 DIAGNOSIS — I10 Essential (primary) hypertension: Secondary | ICD-10-CM | POA: Diagnosis not present

## 2020-04-08 DIAGNOSIS — G309 Alzheimer's disease, unspecified: Secondary | ICD-10-CM | POA: Diagnosis not present

## 2020-04-08 DIAGNOSIS — R531 Weakness: Secondary | ICD-10-CM | POA: Diagnosis not present

## 2020-04-10 DIAGNOSIS — L602 Onychogryphosis: Secondary | ICD-10-CM | POA: Diagnosis not present

## 2020-04-10 DIAGNOSIS — M79674 Pain in right toe(s): Secondary | ICD-10-CM | POA: Diagnosis not present

## 2020-04-10 DIAGNOSIS — M79675 Pain in left toe(s): Secondary | ICD-10-CM | POA: Diagnosis not present

## 2020-04-10 DIAGNOSIS — B351 Tinea unguium: Secondary | ICD-10-CM | POA: Diagnosis not present

## 2020-04-10 DIAGNOSIS — L600 Ingrowing nail: Secondary | ICD-10-CM | POA: Diagnosis not present

## 2020-04-16 DIAGNOSIS — F5101 Primary insomnia: Secondary | ICD-10-CM | POA: Diagnosis not present

## 2020-04-16 DIAGNOSIS — F0391 Unspecified dementia with behavioral disturbance: Secondary | ICD-10-CM | POA: Diagnosis not present

## 2020-04-16 DIAGNOSIS — F411 Generalized anxiety disorder: Secondary | ICD-10-CM | POA: Diagnosis not present

## 2020-04-17 DIAGNOSIS — F0281 Dementia in other diseases classified elsewhere with behavioral disturbance: Secondary | ICD-10-CM | POA: Diagnosis not present

## 2020-04-17 DIAGNOSIS — F3289 Other specified depressive episodes: Secondary | ICD-10-CM | POA: Diagnosis not present

## 2020-04-17 DIAGNOSIS — I10 Essential (primary) hypertension: Secondary | ICD-10-CM | POA: Diagnosis not present

## 2020-04-17 DIAGNOSIS — G301 Alzheimer's disease with late onset: Secondary | ICD-10-CM | POA: Diagnosis not present

## 2020-04-17 DIAGNOSIS — J9611 Chronic respiratory failure with hypoxia: Secondary | ICD-10-CM | POA: Diagnosis not present

## 2020-04-17 DIAGNOSIS — J449 Chronic obstructive pulmonary disease, unspecified: Secondary | ICD-10-CM | POA: Diagnosis not present

## 2020-04-29 DIAGNOSIS — F5101 Primary insomnia: Secondary | ICD-10-CM | POA: Diagnosis not present

## 2020-04-29 DIAGNOSIS — G309 Alzheimer's disease, unspecified: Secondary | ICD-10-CM | POA: Diagnosis not present

## 2020-04-29 DIAGNOSIS — J449 Chronic obstructive pulmonary disease, unspecified: Secondary | ICD-10-CM | POA: Diagnosis not present

## 2020-04-29 DIAGNOSIS — F411 Generalized anxiety disorder: Secondary | ICD-10-CM | POA: Diagnosis not present

## 2020-04-29 DIAGNOSIS — R531 Weakness: Secondary | ICD-10-CM | POA: Diagnosis not present

## 2020-04-29 DIAGNOSIS — I10 Essential (primary) hypertension: Secondary | ICD-10-CM | POA: Diagnosis not present

## 2020-05-06 DIAGNOSIS — L539 Erythematous condition, unspecified: Secondary | ICD-10-CM | POA: Diagnosis not present

## 2020-05-07 DIAGNOSIS — F0391 Unspecified dementia with behavioral disturbance: Secondary | ICD-10-CM | POA: Diagnosis not present

## 2020-05-07 DIAGNOSIS — F5101 Primary insomnia: Secondary | ICD-10-CM | POA: Diagnosis not present

## 2020-05-07 DIAGNOSIS — F411 Generalized anxiety disorder: Secondary | ICD-10-CM | POA: Diagnosis not present

## 2020-05-08 DIAGNOSIS — L539 Erythematous condition, unspecified: Secondary | ICD-10-CM | POA: Diagnosis not present

## 2020-05-13 DIAGNOSIS — L539 Erythematous condition, unspecified: Secondary | ICD-10-CM | POA: Diagnosis not present

## 2020-06-05 DIAGNOSIS — R531 Weakness: Secondary | ICD-10-CM | POA: Diagnosis not present

## 2020-06-05 DIAGNOSIS — G8921 Chronic pain due to trauma: Secondary | ICD-10-CM | POA: Diagnosis not present

## 2020-06-05 DIAGNOSIS — S42214S Unspecified nondisplaced fracture of surgical neck of right humerus, sequela: Secondary | ICD-10-CM | POA: Diagnosis not present

## 2020-06-05 DIAGNOSIS — R2681 Unsteadiness on feet: Secondary | ICD-10-CM | POA: Diagnosis not present

## 2020-06-05 DIAGNOSIS — G309 Alzheimer's disease, unspecified: Secondary | ICD-10-CM | POA: Diagnosis not present

## 2020-06-06 DIAGNOSIS — R2681 Unsteadiness on feet: Secondary | ICD-10-CM | POA: Diagnosis not present

## 2020-06-06 DIAGNOSIS — G309 Alzheimer's disease, unspecified: Secondary | ICD-10-CM | POA: Diagnosis not present

## 2020-06-06 DIAGNOSIS — R531 Weakness: Secondary | ICD-10-CM | POA: Diagnosis not present

## 2020-06-06 DIAGNOSIS — S42214S Unspecified nondisplaced fracture of surgical neck of right humerus, sequela: Secondary | ICD-10-CM | POA: Diagnosis not present

## 2020-06-06 DIAGNOSIS — G8921 Chronic pain due to trauma: Secondary | ICD-10-CM | POA: Diagnosis not present

## 2020-06-09 DIAGNOSIS — R531 Weakness: Secondary | ICD-10-CM | POA: Diagnosis not present

## 2020-06-09 DIAGNOSIS — S42214S Unspecified nondisplaced fracture of surgical neck of right humerus, sequela: Secondary | ICD-10-CM | POA: Diagnosis not present

## 2020-06-09 DIAGNOSIS — G8921 Chronic pain due to trauma: Secondary | ICD-10-CM | POA: Diagnosis not present

## 2020-06-09 DIAGNOSIS — R2681 Unsteadiness on feet: Secondary | ICD-10-CM | POA: Diagnosis not present

## 2020-06-09 DIAGNOSIS — G309 Alzheimer's disease, unspecified: Secondary | ICD-10-CM | POA: Diagnosis not present

## 2020-06-10 DIAGNOSIS — G8921 Chronic pain due to trauma: Secondary | ICD-10-CM | POA: Diagnosis not present

## 2020-06-10 DIAGNOSIS — G309 Alzheimer's disease, unspecified: Secondary | ICD-10-CM | POA: Diagnosis not present

## 2020-06-10 DIAGNOSIS — R2681 Unsteadiness on feet: Secondary | ICD-10-CM | POA: Diagnosis not present

## 2020-06-10 DIAGNOSIS — R531 Weakness: Secondary | ICD-10-CM | POA: Diagnosis not present

## 2020-06-10 DIAGNOSIS — S42214S Unspecified nondisplaced fracture of surgical neck of right humerus, sequela: Secondary | ICD-10-CM | POA: Diagnosis not present

## 2020-06-11 DIAGNOSIS — G8921 Chronic pain due to trauma: Secondary | ICD-10-CM | POA: Diagnosis not present

## 2020-06-11 DIAGNOSIS — R2681 Unsteadiness on feet: Secondary | ICD-10-CM | POA: Diagnosis not present

## 2020-06-11 DIAGNOSIS — R531 Weakness: Secondary | ICD-10-CM | POA: Diagnosis not present

## 2020-06-11 DIAGNOSIS — G309 Alzheimer's disease, unspecified: Secondary | ICD-10-CM | POA: Diagnosis not present

## 2020-06-11 DIAGNOSIS — S42214S Unspecified nondisplaced fracture of surgical neck of right humerus, sequela: Secondary | ICD-10-CM | POA: Diagnosis not present

## 2020-06-12 DIAGNOSIS — L600 Ingrowing nail: Secondary | ICD-10-CM | POA: Diagnosis not present

## 2020-06-12 DIAGNOSIS — B351 Tinea unguium: Secondary | ICD-10-CM | POA: Diagnosis not present

## 2020-06-12 DIAGNOSIS — L03031 Cellulitis of right toe: Secondary | ICD-10-CM | POA: Diagnosis not present

## 2020-06-12 DIAGNOSIS — M79674 Pain in right toe(s): Secondary | ICD-10-CM | POA: Diagnosis not present

## 2020-06-12 DIAGNOSIS — L602 Onychogryphosis: Secondary | ICD-10-CM | POA: Diagnosis not present

## 2020-06-12 DIAGNOSIS — M79675 Pain in left toe(s): Secondary | ICD-10-CM | POA: Diagnosis not present

## 2020-06-13 DIAGNOSIS — F0281 Dementia in other diseases classified elsewhere with behavioral disturbance: Secondary | ICD-10-CM | POA: Diagnosis not present

## 2020-06-13 DIAGNOSIS — S42214S Unspecified nondisplaced fracture of surgical neck of right humerus, sequela: Secondary | ICD-10-CM | POA: Diagnosis not present

## 2020-06-13 DIAGNOSIS — R531 Weakness: Secondary | ICD-10-CM | POA: Diagnosis not present

## 2020-06-13 DIAGNOSIS — G309 Alzheimer's disease, unspecified: Secondary | ICD-10-CM | POA: Diagnosis not present

## 2020-06-13 DIAGNOSIS — J449 Chronic obstructive pulmonary disease, unspecified: Secondary | ICD-10-CM | POA: Diagnosis not present

## 2020-06-13 DIAGNOSIS — G8921 Chronic pain due to trauma: Secondary | ICD-10-CM | POA: Diagnosis not present

## 2020-06-13 DIAGNOSIS — F411 Generalized anxiety disorder: Secondary | ICD-10-CM | POA: Diagnosis not present

## 2020-06-13 DIAGNOSIS — R2681 Unsteadiness on feet: Secondary | ICD-10-CM | POA: Diagnosis not present

## 2020-06-13 DIAGNOSIS — I10 Essential (primary) hypertension: Secondary | ICD-10-CM | POA: Diagnosis not present

## 2020-06-17 DIAGNOSIS — G309 Alzheimer's disease, unspecified: Secondary | ICD-10-CM | POA: Diagnosis not present

## 2020-06-17 DIAGNOSIS — R531 Weakness: Secondary | ICD-10-CM | POA: Diagnosis not present

## 2020-06-17 DIAGNOSIS — S42214S Unspecified nondisplaced fracture of surgical neck of right humerus, sequela: Secondary | ICD-10-CM | POA: Diagnosis not present

## 2020-06-17 DIAGNOSIS — R2681 Unsteadiness on feet: Secondary | ICD-10-CM | POA: Diagnosis not present

## 2020-06-17 DIAGNOSIS — G8921 Chronic pain due to trauma: Secondary | ICD-10-CM | POA: Diagnosis not present

## 2020-06-18 DIAGNOSIS — G8921 Chronic pain due to trauma: Secondary | ICD-10-CM | POA: Diagnosis not present

## 2020-06-18 DIAGNOSIS — S42214S Unspecified nondisplaced fracture of surgical neck of right humerus, sequela: Secondary | ICD-10-CM | POA: Diagnosis not present

## 2020-06-18 DIAGNOSIS — R2681 Unsteadiness on feet: Secondary | ICD-10-CM | POA: Diagnosis not present

## 2020-06-18 DIAGNOSIS — G309 Alzheimer's disease, unspecified: Secondary | ICD-10-CM | POA: Diagnosis not present

## 2020-06-18 DIAGNOSIS — R531 Weakness: Secondary | ICD-10-CM | POA: Diagnosis not present

## 2020-06-19 DIAGNOSIS — G8921 Chronic pain due to trauma: Secondary | ICD-10-CM | POA: Diagnosis not present

## 2020-06-19 DIAGNOSIS — S42214S Unspecified nondisplaced fracture of surgical neck of right humerus, sequela: Secondary | ICD-10-CM | POA: Diagnosis not present

## 2020-06-19 DIAGNOSIS — R531 Weakness: Secondary | ICD-10-CM | POA: Diagnosis not present

## 2020-06-19 DIAGNOSIS — R2681 Unsteadiness on feet: Secondary | ICD-10-CM | POA: Diagnosis not present

## 2020-06-19 DIAGNOSIS — G309 Alzheimer's disease, unspecified: Secondary | ICD-10-CM | POA: Diagnosis not present

## 2020-06-20 DIAGNOSIS — R531 Weakness: Secondary | ICD-10-CM | POA: Diagnosis not present

## 2020-06-20 DIAGNOSIS — R2681 Unsteadiness on feet: Secondary | ICD-10-CM | POA: Diagnosis not present

## 2020-06-20 DIAGNOSIS — G309 Alzheimer's disease, unspecified: Secondary | ICD-10-CM | POA: Diagnosis not present

## 2020-06-20 DIAGNOSIS — S42214S Unspecified nondisplaced fracture of surgical neck of right humerus, sequela: Secondary | ICD-10-CM | POA: Diagnosis not present

## 2020-06-20 DIAGNOSIS — G8921 Chronic pain due to trauma: Secondary | ICD-10-CM | POA: Diagnosis not present

## 2020-06-23 DIAGNOSIS — R2681 Unsteadiness on feet: Secondary | ICD-10-CM | POA: Diagnosis not present

## 2020-06-23 DIAGNOSIS — G8921 Chronic pain due to trauma: Secondary | ICD-10-CM | POA: Diagnosis not present

## 2020-06-23 DIAGNOSIS — G309 Alzheimer's disease, unspecified: Secondary | ICD-10-CM | POA: Diagnosis not present

## 2020-06-23 DIAGNOSIS — S42214S Unspecified nondisplaced fracture of surgical neck of right humerus, sequela: Secondary | ICD-10-CM | POA: Diagnosis not present

## 2020-06-23 DIAGNOSIS — R531 Weakness: Secondary | ICD-10-CM | POA: Diagnosis not present

## 2020-06-24 DIAGNOSIS — R2681 Unsteadiness on feet: Secondary | ICD-10-CM | POA: Diagnosis not present

## 2020-06-24 DIAGNOSIS — R531 Weakness: Secondary | ICD-10-CM | POA: Diagnosis not present

## 2020-06-24 DIAGNOSIS — G8921 Chronic pain due to trauma: Secondary | ICD-10-CM | POA: Diagnosis not present

## 2020-06-24 DIAGNOSIS — S42214S Unspecified nondisplaced fracture of surgical neck of right humerus, sequela: Secondary | ICD-10-CM | POA: Diagnosis not present

## 2020-06-24 DIAGNOSIS — G309 Alzheimer's disease, unspecified: Secondary | ICD-10-CM | POA: Diagnosis not present

## 2020-06-25 DIAGNOSIS — R531 Weakness: Secondary | ICD-10-CM | POA: Diagnosis not present

## 2020-06-25 DIAGNOSIS — R2681 Unsteadiness on feet: Secondary | ICD-10-CM | POA: Diagnosis not present

## 2020-06-25 DIAGNOSIS — G8921 Chronic pain due to trauma: Secondary | ICD-10-CM | POA: Diagnosis not present

## 2020-06-25 DIAGNOSIS — G309 Alzheimer's disease, unspecified: Secondary | ICD-10-CM | POA: Diagnosis not present

## 2020-06-25 DIAGNOSIS — S42214S Unspecified nondisplaced fracture of surgical neck of right humerus, sequela: Secondary | ICD-10-CM | POA: Diagnosis not present

## 2020-06-26 DIAGNOSIS — S42214S Unspecified nondisplaced fracture of surgical neck of right humerus, sequela: Secondary | ICD-10-CM | POA: Diagnosis not present

## 2020-06-26 DIAGNOSIS — G8921 Chronic pain due to trauma: Secondary | ICD-10-CM | POA: Diagnosis not present

## 2020-06-26 DIAGNOSIS — R531 Weakness: Secondary | ICD-10-CM | POA: Diagnosis not present

## 2020-06-26 DIAGNOSIS — R2681 Unsteadiness on feet: Secondary | ICD-10-CM | POA: Diagnosis not present

## 2020-06-26 DIAGNOSIS — G309 Alzheimer's disease, unspecified: Secondary | ICD-10-CM | POA: Diagnosis not present

## 2020-06-30 DIAGNOSIS — G309 Alzheimer's disease, unspecified: Secondary | ICD-10-CM | POA: Diagnosis not present

## 2020-06-30 DIAGNOSIS — G8921 Chronic pain due to trauma: Secondary | ICD-10-CM | POA: Diagnosis not present

## 2020-06-30 DIAGNOSIS — R531 Weakness: Secondary | ICD-10-CM | POA: Diagnosis not present

## 2020-06-30 DIAGNOSIS — S42214S Unspecified nondisplaced fracture of surgical neck of right humerus, sequela: Secondary | ICD-10-CM | POA: Diagnosis not present

## 2020-06-30 DIAGNOSIS — R2681 Unsteadiness on feet: Secondary | ICD-10-CM | POA: Diagnosis not present

## 2020-07-03 DIAGNOSIS — G309 Alzheimer's disease, unspecified: Secondary | ICD-10-CM | POA: Diagnosis not present

## 2020-07-03 DIAGNOSIS — R2681 Unsteadiness on feet: Secondary | ICD-10-CM | POA: Diagnosis not present

## 2020-07-03 DIAGNOSIS — G8921 Chronic pain due to trauma: Secondary | ICD-10-CM | POA: Diagnosis not present

## 2020-07-03 DIAGNOSIS — S42214S Unspecified nondisplaced fracture of surgical neck of right humerus, sequela: Secondary | ICD-10-CM | POA: Diagnosis not present

## 2020-07-03 DIAGNOSIS — R531 Weakness: Secondary | ICD-10-CM | POA: Diagnosis not present

## 2020-07-04 DIAGNOSIS — G8921 Chronic pain due to trauma: Secondary | ICD-10-CM | POA: Diagnosis not present

## 2020-07-04 DIAGNOSIS — S42214S Unspecified nondisplaced fracture of surgical neck of right humerus, sequela: Secondary | ICD-10-CM | POA: Diagnosis not present

## 2020-07-04 DIAGNOSIS — R2681 Unsteadiness on feet: Secondary | ICD-10-CM | POA: Diagnosis not present

## 2020-07-04 DIAGNOSIS — R531 Weakness: Secondary | ICD-10-CM | POA: Diagnosis not present

## 2020-07-04 DIAGNOSIS — G309 Alzheimer's disease, unspecified: Secondary | ICD-10-CM | POA: Diagnosis not present

## 2022-09-03 DEATH — deceased
